# Patient Record
Sex: Male | Born: 1940 | Race: White | Hispanic: No | Marital: Married | Smoking: Former smoker
Health system: Southern US, Community
[De-identification: ages and names within clinical notes are randomized; demographics above are authoritative.]

## PROBLEM LIST (undated history)

## (undated) DIAGNOSIS — E785 Hyperlipidemia, unspecified: Secondary | ICD-10-CM

## (undated) DIAGNOSIS — Z950 Presence of cardiac pacemaker: Secondary | ICD-10-CM

## (undated) DIAGNOSIS — I10 Essential (primary) hypertension: Secondary | ICD-10-CM

## (undated) DIAGNOSIS — E119 Type 2 diabetes mellitus without complications: Secondary | ICD-10-CM

## (undated) DIAGNOSIS — I4510 Unspecified right bundle-branch block: Secondary | ICD-10-CM

## (undated) DIAGNOSIS — I442 Atrioventricular block, complete: Secondary | ICD-10-CM

## (undated) DIAGNOSIS — N189 Chronic kidney disease, unspecified: Secondary | ICD-10-CM

## (undated) DIAGNOSIS — R55 Syncope and collapse: Secondary | ICD-10-CM

## (undated) DIAGNOSIS — R011 Cardiac murmur, unspecified: Secondary | ICD-10-CM

## (undated) HISTORY — DX: Syncope and collapse: R55

## (undated) HISTORY — DX: Atrioventricular block, complete: I44.2

## (undated) HISTORY — DX: Unspecified right bundle-branch block: I45.10

## (undated) HISTORY — PX: PACEMAKER INSERTION: SHX728

## (undated) HISTORY — DX: Cardiac murmur, unspecified: R01.1

## (undated) HISTORY — DX: Hyperlipidemia, unspecified: E78.5

## (undated) HISTORY — DX: Essential (primary) hypertension: I10

## (undated) HISTORY — DX: Chronic kidney disease, unspecified: N18.9

## (undated) HISTORY — DX: Type 2 diabetes mellitus without complications: E11.9

---

## 2010-04-04 ENCOUNTER — Emergency Department: Payer: Self-pay | Admitting: Emergency Medicine

## 2010-05-06 ENCOUNTER — Ambulatory Visit: Payer: Self-pay | Admitting: Internal Medicine

## 2013-08-18 ENCOUNTER — Ambulatory Visit: Payer: Self-pay | Admitting: Internal Medicine

## 2013-09-02 ENCOUNTER — Ambulatory Visit: Payer: Self-pay | Admitting: Internal Medicine

## 2013-10-08 ENCOUNTER — Inpatient Hospital Stay: Payer: Self-pay | Admitting: Internal Medicine

## 2013-10-08 LAB — BASIC METABOLIC PANEL
ANION GAP: 12 (ref 7–16)
Anion Gap: 8 (ref 7–16)
Anion Gap: 8 (ref 7–16)
BUN: 56 mg/dL — AB (ref 7–18)
BUN: 56 mg/dL — AB (ref 7–18)
BUN: 53 mg/dL — ABNORMAL HIGH (ref 7–18)
CALCIUM: 10.4 mg/dL — AB (ref 8.5–10.1)
CALCIUM: 11.1 mg/dL — AB (ref 8.5–10.1)
CO2: 22 mmol/L (ref 21–32)
CREATININE: 2.49 mg/dL — AB (ref 0.60–1.30)
Calcium, Total: 10.5 mg/dL — ABNORMAL HIGH (ref 8.5–10.1)
Chloride: 84 mmol/L — ABNORMAL LOW (ref 98–107)
Chloride: 91 mmol/L — ABNORMAL LOW (ref 98–107)
Chloride: 97 mmol/L — ABNORMAL LOW (ref 98–107)
Co2: 29 mmol/L (ref 21–32)
Co2: 29 mmol/L (ref 21–32)
Creatinine: 2.71 mg/dL — ABNORMAL HIGH (ref 0.60–1.30)
Creatinine: 2.54 mg/dL — ABNORMAL HIGH (ref 0.60–1.30)
EGFR (African American): 26 — ABNORMAL LOW
EGFR (African American): 28 — ABNORMAL LOW
EGFR (African American): 29 — ABNORMAL LOW
EGFR (Non-African Amer.): 23 — ABNORMAL LOW
GFR CALC NON AF AMER: 24 — AB
GFR CALC NON AF AMER: 25 — AB
GLUCOSE: 897 mg/dL — AB (ref 65–99)
GLUCOSE: 336 mg/dL — AB (ref 65–99)
Glucose: 572 mg/dL (ref 65–99)
OSMOLALITY: 296 (ref 275–301)
Osmolality: 298 (ref 275–301)
Osmolality: 299 (ref 275–301)
Potassium: 6.1 mmol/L — ABNORMAL HIGH (ref 3.5–5.1)
Potassium: 4.3 mmol/L (ref 3.5–5.1)
Potassium: 4 mmol/L (ref 3.5–5.1)
Sodium: 118 mmol/L — CL (ref 136–145)
Sodium: 128 mmol/L — ABNORMAL LOW (ref 136–145)
Sodium: 134 mmol/L — ABNORMAL LOW (ref 136–145)

## 2013-10-08 LAB — TROPONIN I: Troponin-I: 0.03 ng/mL

## 2013-10-08 LAB — CBC WITH DIFFERENTIAL/PLATELET
BASOS ABS: 0 10*3/uL (ref 0.0–0.1)
BASOS PCT: 0.2 %
EOS ABS: 0.1 10*3/uL (ref 0.0–0.7)
Eosinophil %: 1 %
HCT: 47.5 % (ref 40.0–52.0)
HGB: 15.4 g/dL (ref 13.0–18.0)
LYMPHS ABS: 1.8 10*3/uL (ref 1.0–3.6)
Lymphocyte %: 15.2 %
MCH: 29.3 pg (ref 26.0–34.0)
MCHC: 32.3 g/dL (ref 32.0–36.0)
MCV: 91 fL (ref 80–100)
MONO ABS: 0.6 x10 3/mm (ref 0.2–1.0)
MONOS PCT: 5.1 %
Neutrophil #: 9.3 10*3/uL — ABNORMAL HIGH (ref 1.4–6.5)
Neutrophil %: 78.5 %
PLATELETS: 305 10*3/uL (ref 150–440)
RBC: 5.24 10*6/uL (ref 4.40–5.90)
RDW: 12.8 % (ref 11.5–14.5)
WBC: 11.9 10*3/uL — ABNORMAL HIGH (ref 3.8–10.6)

## 2013-10-08 LAB — MAGNESIUM: Magnesium: 1.8 mg/dL

## 2013-10-08 LAB — HEMOGLOBIN A1C: Hemoglobin A1C: 13.8 % — ABNORMAL HIGH (ref 4.2–6.3)

## 2013-10-09 DIAGNOSIS — I442 Atrioventricular block, complete: Secondary | ICD-10-CM

## 2013-10-09 LAB — URINALYSIS, COMPLETE
BACTERIA: NONE SEEN
Bilirubin,UR: NEGATIVE
Leukocyte Esterase: NEGATIVE
Nitrite: NEGATIVE
PH: 6 (ref 4.5–8.0)
PROTEIN: NEGATIVE
RBC,UR: 2 /HPF (ref 0–5)
SPECIFIC GRAVITY: 1.021 (ref 1.003–1.030)
Squamous Epithelial: NONE SEEN
WBC UR: 1 /HPF (ref 0–5)

## 2013-10-09 LAB — PROTEIN / CREATININE RATIO, URINE
CREATININE, URINE: 53.8 mg/dL (ref 30.0–125.0)
PROTEIN, RANDOM URINE: 18 mg/dL — AB (ref 0–12)
Protein/Creat. Ratio: 335 mg/gCREAT — ABNORMAL HIGH (ref 0–200)

## 2013-10-09 LAB — LIPID PANEL
Cholesterol: 158 mg/dL (ref 0–200)
HDL Cholesterol: 36 mg/dL — ABNORMAL LOW (ref 40–60)
LDL CHOLESTEROL, CALC: 98 mg/dL (ref 0–100)
Triglycerides: 122 mg/dL (ref 0–200)
VLDL Cholesterol, Calc: 24 mg/dL (ref 5–40)

## 2013-10-09 LAB — TROPONIN I
Troponin-I: 0.03 ng/mL
Troponin-I: 0.02 ng/mL

## 2013-10-09 LAB — CBC WITH DIFFERENTIAL/PLATELET
BASOS PCT: 0.2 %
Basophil #: 0 10*3/uL (ref 0.0–0.1)
EOS ABS: 0.1 10*3/uL (ref 0.0–0.7)
Eosinophil %: 0.4 %
HCT: 42.3 % (ref 40.0–52.0)
HGB: 14 g/dL (ref 13.0–18.0)
Lymphocyte #: 0.9 10*3/uL — ABNORMAL LOW (ref 1.0–3.6)
Lymphocyte %: 6 %
MCH: 29.1 pg (ref 26.0–34.0)
MCHC: 33 g/dL (ref 32.0–36.0)
MCV: 88 fL (ref 80–100)
Monocyte #: 0.5 x10 3/mm (ref 0.2–1.0)
Monocyte %: 3.2 %
NEUTROS ABS: 13.5 10*3/uL — AB (ref 1.4–6.5)
Neutrophil %: 90.2 %
Platelet: 238 10*3/uL (ref 150–440)
RBC: 4.79 10*6/uL (ref 4.40–5.90)
RDW: 12.8 % (ref 11.5–14.5)
WBC: 15 10*3/uL — ABNORMAL HIGH (ref 3.8–10.6)

## 2013-10-09 LAB — BASIC METABOLIC PANEL WITH GFR
Anion Gap: 14
BUN: 50 mg/dL — ABNORMAL HIGH
Calcium, Total: 10.2 mg/dL — ABNORMAL HIGH
Chloride: 98 mmol/L
Co2: 24 mmol/L
Creatinine: 2.06 mg/dL — ABNORMAL HIGH
EGFR (African American): 36 — ABNORMAL LOW
EGFR (Non-African Amer.): 31 — ABNORMAL LOW
Glucose: 259 mg/dL — ABNORMAL HIGH
Osmolality: 294
Potassium: 4 mmol/L
Sodium: 136 mmol/L

## 2013-10-09 LAB — URIC ACID: Uric Acid: 8.6 mg/dL — ABNORMAL HIGH

## 2013-10-10 LAB — BASIC METABOLIC PANEL
ANION GAP: 8 (ref 7–16)
BUN: 30 mg/dL — ABNORMAL HIGH (ref 7–18)
Calcium, Total: 9.2 mg/dL (ref 8.5–10.1)
Chloride: 106 mmol/L (ref 98–107)
Co2: 24 mmol/L (ref 21–32)
Creatinine: 1.77 mg/dL — ABNORMAL HIGH (ref 0.60–1.30)
EGFR (African American): 44 — ABNORMAL LOW
EGFR (Non-African Amer.): 38 — ABNORMAL LOW
Glucose: 79 mg/dL (ref 65–99)
Osmolality: 281 (ref 275–301)
Potassium: 3.1 mmol/L — ABNORMAL LOW (ref 3.5–5.1)
Sodium: 138 mmol/L (ref 136–145)

## 2013-10-10 LAB — PROTEIN ELECTROPHORESIS(ARMC)

## 2013-10-10 LAB — KAPPA/LAMBDA FREE LIGHT CHAINS (ARMC)

## 2013-10-15 LAB — UR PROT ELECTROPHORESIS, URINE RANDOM

## 2013-11-18 ENCOUNTER — Encounter: Payer: Self-pay | Admitting: *Deleted

## 2013-11-24 ENCOUNTER — Encounter: Payer: Self-pay | Admitting: *Deleted

## 2013-11-25 ENCOUNTER — Encounter (HOSPITAL_COMMUNITY): Payer: Self-pay | Admitting: Pharmacy Technician

## 2013-11-25 ENCOUNTER — Ambulatory Visit: Payer: Self-pay | Admitting: Internal Medicine

## 2013-11-25 ENCOUNTER — Ambulatory Visit (INDEPENDENT_AMBULATORY_CARE_PROVIDER_SITE_OTHER): Payer: Medicare Other | Admitting: Internal Medicine

## 2013-11-25 ENCOUNTER — Encounter: Payer: Self-pay | Admitting: *Deleted

## 2013-11-25 ENCOUNTER — Encounter: Payer: Self-pay | Admitting: Internal Medicine

## 2013-11-25 VITALS — BP 182/71 | HR 40 | Ht 67.0 in | Wt 163.5 lb

## 2013-11-25 DIAGNOSIS — I498 Other specified cardiac arrhythmias: Secondary | ICD-10-CM

## 2013-11-25 DIAGNOSIS — Z01812 Encounter for preprocedural laboratory examination: Secondary | ICD-10-CM

## 2013-11-25 DIAGNOSIS — R001 Bradycardia, unspecified: Secondary | ICD-10-CM

## 2013-11-25 DIAGNOSIS — I442 Atrioventricular block, complete: Secondary | ICD-10-CM

## 2013-11-25 MED ORDER — SODIUM CHLORIDE 0.9 % IR SOLN
80.0000 mg | Status: DC
  Filled 2013-11-25 (×2): qty 2

## 2013-11-25 MED ORDER — SODIUM CHLORIDE 0.9 % IV SOLN: INTRAVENOUS | Status: DC

## 2013-11-25 MED ORDER — CEFAZOLIN SODIUM-DEXTROSE 2-3 GM-% IV SOLR: 2.0000 g | INTRAVENOUS | Status: DC

## 2013-11-25 MED ORDER — CHLORHEXIDINE GLUCONATE 4 % EX LIQD
60.0000 mL | Freq: Once | CUTANEOUS | Status: DC
  Filled 2013-11-25: qty 60

## 2013-11-25 NOTE — Patient Instructions (Addendum)
Your physician has recommended that you have a pacemaker inserted. A pacemaker is a small device that is placed under the skin of your chest or abdomen to help control abnormal heart rhythms. This device uses electrical pulses to prompt the heart to beat at a normal rate. Pacemakers are used to treat heart rhythms that are too slow. Wire (leads) are attached to the pacemaker that goes into the chambers of you heart. This is done in the hospital and usually requires and overnight stay. Please see the instruction sheet given to you today for more information.  Your procedure will be at [facility] on 11/26/13 at 3 pm. You will need to arrive at 1 pm  Your physician recommends that you have pre procedure labs today:  BMP INR  CBC

## 2013-11-25 NOTE — Progress Notes (Signed)
ELECTROPHYSIOLOGY CONSULT NOTE  Patient ID: Kevin Gibson, MRN: [id number], DOB/AGE: [birth year] 30 y.o. Admit date: (Not on file) Date of Consult: 11/25/2013  Primary Physician: Kirk Ruths., MD Primary Cardiologist: new   Chief Complaint:  Syncope and bradycardia   HPI Kevin Gibson is a 73 y.o. male  The past medical history notable for diabetes hypertension and moderate renal insufficiency with a history of abrupt onset offset syncope earlier in May 2015. He presented to hospital and was found to be in complete heart block (ECG not available). He was on carvedilol and this was discontinued. 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  He underwent a noninvasive cardiac evaluation including echocardiogram demonstrated normal left ventricular function and Myoview scanning demonstrated no ischemia.  Carvedilol was discontinued and he was discharged home. He continues to complain of exercise intolerance and his heart rate which was moderately 70 is now in the 40s.  He denies exertional chest pain. He has had no palpitations. He's had no recurrent syncope. He has not had peripheral edema.      Past Medical History  Diagnosis Date  . Hypertension   . Diabetes mellitus without complication   . Hyperlipidemia   . Chronic kidney disease   . Third degree heart block   . RBBB   . Syncope and collapse   . Heart murmur       Surgical History: History reviewed. No pertinent past surgical history.   Home Meds: Prior to Admission medications   Medication Sig Start Date End Date Taking? Authorizing Provider  insulin NPH-regular Human (NOVOLIN 70/30) (70-30) 100 UNIT/ML injection Inject 20 Units into the skin 2 (two) times daily with a meal.   Yes Historical Provider, MD  lisinopril-hydrochlorothiazide (PRINZIDE,ZESTORETIC) 20-25 MG per tablet Take 1 tablet by mouth daily.   Yes Historical Provider, MD  losartan (COZAAR) 100 MG tablet Take  100 mg by mouth daily.   Yes Historical Provider, MD      Allergies: No Known Allergies  History   Social History  . Marital Status: Unknown    Spouse Name: N/A    Number of Children: N/A  . Years of Education: N/A   Occupational History  . Not on file.   Social History Main Topics  . Smoking status: Former Smoker -- 0 years    Types: Cigarettes  . Smokeless tobacco: Not on file  . Alcohol Use: No  . Drug Use: No  . Sexual Activity: Not on file   Other Topics Concern  . Not on file   Social History Narrative  . No narrative on file     Family History  Problem Relation Age of Onset  . Diabetes Mother      ROS:  Please see the history of present illness.     All other systems reviewed and negative.    Physical Exam: Blood pressure 182/71, pulse 40, height 5\' 7"  (1.702 m), weight 163 lb 8 oz (74.163 kg). General: Well developed, well nourished male in no acute distress. Head: Normocephalic, atraumatic, sclera non-icteric, no xanthomas, nares are without discharge. EENT: normal Lymph Nodes:  none Back: without scoliosis/kyphosis , no CVA tendersness Neck: Negative for carotid bruits. JVD 7-8 cm scan in a way as Lungs: Clear bilaterally to auscultation without wheezes, rales, or rhonchi. Breathing is unlabored. Heart: RRR with S1 S2.  2/6 systolic\ murmur , rubs, or gallops appreciated. Abdomen: Soft, non-tender, non-distended with normoactive bowel sounds. No hepatomegaly. No rebound/guarding. No obvious abdominal  masses. Msk:  Strength and tone appear normal for age. Extremities: No clubbing or cyanosis. No edema.  Distal pedal pulses are 2+ and equal bilaterally. Skin: Warm and Dry Neuro: Alert and oriented X 3. CN III-XII intact Grossly normal sensory and motor function . Psych:  Responds to questions appropriately with a normal affect.      Labs: Cardiac Enzymes No results found for this basename: CKTOTAL, CKMB, TROPONINI,  in the last 72 hours CBC No  results found for this basename: WBC, HGB, HCT, MCV, PLT   PROTIME: No results found for this basename: LABPROT, INR,  in the last 72 hours Chemistry No results found for this basename: NA, K, CL, CO2, BUN, CREATININE, CALCIUM, LABALBU, PROT, BILITOT, ALKPHOS, ALT, AST, GLUCOSE,  in the last 168 hours Lipids No results found for this basename: CHOL, HDL, LDLCALC, TRIG   BNP No results found for this basename: probnp   Miscellaneous No results found for this basename: DDIMER    Radiology/Studies:     EKG sinus rhythm at 80 beats per minute with complete heart block and a right bundle branch left axis deviation ST  Assessment and Plan: Syncope consistent with Stokes Adams attacks  Complete heart block with junctional escape  Right bundle branch block left anterior fascicular block  Hypertension-poorly controlled  Diabetes-poorly controlled  The patient has high-grade heart block. He has had recurrent syncope. He is advised not to drive. At this point he is not on any medications noncontributory heart block. Pacing is indicated.  The benefits and risks were reviewed including but not limited to death,  perforation, infection, lead dislodgement and device malfunction.  The patient understands agrees and is willing to proceed.  He will need aggressive management of his blood pressure.  He is advised not to drive.  [name]

## 2013-11-26 ENCOUNTER — Inpatient Hospital Stay (HOSPITAL_COMMUNITY)
Admission: RE | Admit: 2013-11-26 | Discharge: 2013-11-28 | DRG: 244 | Disposition: A | Discharge status: Home / Self Care | Payer: Medicare Other | Source: Ambulatory Visit | Attending: Internal Medicine | Admitting: Internal Medicine

## 2013-11-26 ENCOUNTER — Inpatient Hospital Stay (HOSPITAL_COMMUNITY): Payer: Medicare Other

## 2013-11-26 ENCOUNTER — Encounter (HOSPITAL_COMMUNITY)
Admission: RE | Disposition: A | Discharge status: Home / Self Care | Payer: Self-pay | Source: Ambulatory Visit | Attending: Internal Medicine

## 2013-11-26 ENCOUNTER — Encounter (HOSPITAL_COMMUNITY): Payer: Self-pay | Admitting: Anesthesiology

## 2013-11-26 DIAGNOSIS — E119 Type 2 diabetes mellitus without complications: Secondary | ICD-10-CM | POA: Diagnosis present

## 2013-11-26 DIAGNOSIS — E1122 Type 2 diabetes mellitus with diabetic chronic kidney disease: Secondary | ICD-10-CM

## 2013-11-26 DIAGNOSIS — I452 Bifascicular block: Secondary | ICD-10-CM | POA: Diagnosis present

## 2013-11-26 DIAGNOSIS — Z950 Presence of cardiac pacemaker: Secondary | ICD-10-CM

## 2013-11-26 DIAGNOSIS — I129 Hypertensive chronic kidney disease with stage 1 through stage 4 chronic kidney disease, or unspecified chronic kidney disease: Secondary | ICD-10-CM | POA: Diagnosis present

## 2013-11-26 DIAGNOSIS — R55 Syncope and collapse: Secondary | ICD-10-CM | POA: Diagnosis present

## 2013-11-26 DIAGNOSIS — E785 Hyperlipidemia, unspecified: Secondary | ICD-10-CM | POA: Diagnosis present

## 2013-11-26 DIAGNOSIS — I442 Atrioventricular block, complete: Secondary | ICD-10-CM

## 2013-11-26 DIAGNOSIS — E1322 Other specified diabetes mellitus with diabetic chronic kidney disease: Secondary | ICD-10-CM

## 2013-11-26 DIAGNOSIS — Z833 Family history of diabetes mellitus: Secondary | ICD-10-CM

## 2013-11-26 DIAGNOSIS — N189 Chronic kidney disease, unspecified: Secondary | ICD-10-CM | POA: Diagnosis present

## 2013-11-26 DIAGNOSIS — I498 Other specified cardiac arrhythmias: Secondary | ICD-10-CM | POA: Diagnosis present

## 2013-11-26 DIAGNOSIS — Z87891 Personal history of nicotine dependence: Secondary | ICD-10-CM

## 2013-11-26 DIAGNOSIS — Z7982 Long term (current) use of aspirin: Secondary | ICD-10-CM

## 2013-11-26 DIAGNOSIS — E1159 Type 2 diabetes mellitus with other circulatory complications: Secondary | ICD-10-CM

## 2013-11-26 DIAGNOSIS — Z79899 Other long term (current) drug therapy: Secondary | ICD-10-CM

## 2013-11-26 DIAGNOSIS — I152 Hypertension secondary to endocrine disorders: Secondary | ICD-10-CM

## 2013-11-26 DIAGNOSIS — Z794 Long term (current) use of insulin: Secondary | ICD-10-CM

## 2013-11-26 DIAGNOSIS — I10 Essential (primary) hypertension: Secondary | ICD-10-CM

## 2013-11-26 HISTORY — DX: Presence of cardiac pacemaker: Z95.0

## 2013-11-26 HISTORY — PX: PERMANENT PACEMAKER INSERTION: SHX5480

## 2013-11-26 LAB — PROTIME-INR
INR: 1.1 (ref 0.8–1.2)
PROTHROMBIN TIME: 10.8 s (ref 9.1–12.0)

## 2013-11-26 LAB — CBC WITH DIFFERENTIAL
BASOS ABS: 0 10*3/uL (ref 0.0–0.2)
Basos: 0 %
EOS: 5 %
Eosinophils Absolute: 0.3 10*3/uL (ref 0.0–0.4)
HCT: 38.1 % (ref 37.5–51.0)
Hemoglobin: 12.4 g/dL — ABNORMAL LOW (ref 12.6–17.7)
IMMATURE GRANS (ABS): 0 10*3/uL (ref 0.0–0.1)
IMMATURE GRANULOCYTES: 0 %
LYMPHS ABS: 1.3 10*3/uL (ref 0.7–3.1)
Lymphs: 25 %
MCH: 29.3 pg (ref 26.6–33.0)
MCHC: 32.5 g/dL (ref 31.5–35.7)
MCV: 90 fL (ref 79–97)
MONOS ABS: 0.3 10*3/uL (ref 0.1–0.9)
Monocytes: 7 %
NEUTROS PCT: 63 %
Neutrophils Absolute: 3.1 10*3/uL (ref 1.4–7.0)
Platelets: 193 10*3/uL (ref 150–379)
RBC: 4.23 x10E6/uL (ref 4.14–5.80)
RDW: 14.3 % (ref 12.3–15.4)
WBC: 5 10*3/uL (ref 3.4–10.8)

## 2013-11-26 LAB — BASIC METABOLIC PANEL
BUN/Creatinine Ratio: 11 (ref 10–22)
BUN: 18 mg/dL (ref 8–27)
CALCIUM: 9.3 mg/dL (ref 8.6–10.2)
CO2: 21 mmol/L (ref 18–29)
Chloride: 99 mmol/L (ref 97–108)
Creatinine, Ser: 1.66 mg/dL — ABNORMAL HIGH (ref 0.76–1.27)
GFR calc Af Amer: 47 mL/min/{1.73_m2} — ABNORMAL LOW (ref >59)
GFR calc non Af Amer: 41 mL/min/{1.73_m2} — ABNORMAL LOW (ref >59)
Glucose: 120 mg/dL — ABNORMAL HIGH (ref 65–99)
Potassium: 3.9 mmol/L (ref 3.5–5.2)
SODIUM: 140 mmol/L (ref 134–144)

## 2013-11-26 LAB — SURGICAL PCR SCREEN
MRSA, PCR: NEGATIVE
STAPHYLOCOCCUS AUREUS: POSITIVE — AB

## 2013-11-26 LAB — GLUCOSE, CAPILLARY
Glucose-Capillary: 112 mg/dL — ABNORMAL HIGH (ref 70–99)
Glucose-Capillary: 103 mg/dL — ABNORMAL HIGH (ref 70–99)

## 2013-11-26 SURGERY — PERMANENT PACEMAKER INSERTION
Anesthesia: LOCAL

## 2013-11-26 MED ORDER — CEFAZOLIN SODIUM 1-5 GM-% IV SOLN
1.0000 g | Freq: Four times a day (QID) | INTRAVENOUS | Status: AC
  Administered 2013-11-26 – 2013-11-27 (×3): 1 g via INTRAVENOUS
  Filled 2013-11-26 (×3): qty 50

## 2013-11-26 MED ORDER — SODIUM CHLORIDE 0.9 % IV SOLN
INTRAVENOUS | Status: AC
  Administered 2013-11-26: 18:00:00 via INTRAVENOUS

## 2013-11-26 MED ORDER — MUPIROCIN 2 % EX OINT
1.0000 "application " | TOPICAL_OINTMENT | Freq: Two times a day (BID) | CUTANEOUS | Status: DC
  Administered 2013-11-26 – 2013-11-28 (×4): 1 via NASAL
  Filled 2013-11-26: qty 22

## 2013-11-26 MED ORDER — ASPIRIN EC 81 MG PO TBEC
81.0000 mg | DELAYED_RELEASE_TABLET | ORAL | Status: DC
  Administered 2013-11-28: 81 mg via ORAL
  Filled 2013-11-26 (×2): qty 1

## 2013-11-26 MED ORDER — LABETALOL HCL 200 MG PO TABS
200.0000 mg | ORAL_TABLET | Freq: Two times a day (BID) | ORAL | Status: DC
  Administered 2013-11-26 – 2013-11-28 (×4): 200 mg via ORAL
  Filled 2013-11-26 (×7): qty 1

## 2013-11-26 MED ORDER — INSULIN ASPART PROT & ASPART (70-30 MIX) 100 UNIT/ML ~~LOC~~ SUSP
20.0000 [IU] | Freq: Two times a day (BID) | SUBCUTANEOUS | Status: DC
  Administered 2013-11-26 – 2013-11-28 (×4): 20 [IU] via SUBCUTANEOUS
  Filled 2013-11-26: qty 10

## 2013-11-26 MED ORDER — FENTANYL CITRATE 0.05 MG/ML IJ SOLN
INTRAMUSCULAR | Status: AC
  Filled 2013-11-26: qty 2

## 2013-11-26 MED ORDER — MIDAZOLAM HCL 5 MG/5ML IJ SOLN
INTRAMUSCULAR | Status: AC
  Filled 2013-11-26: qty 5

## 2013-11-26 MED ORDER — HYDROCHLOROTHIAZIDE 25 MG PO TABS
25.0000 mg | ORAL_TABLET | Freq: Every day | ORAL | Status: DC
  Administered 2013-11-26 – 2013-11-28 (×3): 25 mg via ORAL
  Filled 2013-11-26 (×3): qty 1

## 2013-11-26 MED ORDER — CHLORHEXIDINE GLUCONATE CLOTH 2 % EX PADS
6.0000 | MEDICATED_PAD | Freq: Every day | CUTANEOUS | Status: DC
  Administered 2013-11-26 – 2013-11-27 (×2): 6 via TOPICAL

## 2013-11-26 MED ORDER — CHLORHEXIDINE GLUCONATE 4 % EX LIQD: 60.0000 mL | Freq: Once | CUTANEOUS | Status: DC

## 2013-11-26 MED ORDER — MUPIROCIN 2 % EX OINT
TOPICAL_OINTMENT | CUTANEOUS | Status: AC
  Filled 2013-11-26: qty 22

## 2013-11-26 MED ORDER — ACETAMINOPHEN 325 MG PO TABS: 325.0000 mg | ORAL_TABLET | ORAL | Status: DC | PRN

## 2013-11-26 MED ORDER — SODIUM CHLORIDE 0.9 % IV SOLN
INTRAVENOUS | Status: DC
  Administered 2013-11-26: 14:00:00 via INTRAVENOUS

## 2013-11-26 MED ORDER — YOU HAVE A PACEMAKER BOOK
Freq: Once | Status: AC
Start: 2013-11-26 — End: 2013-11-26
  Administered 2013-11-26: 18:00:00
  Filled 2013-11-26: qty 1

## 2013-11-26 MED ORDER — MUPIROCIN 2 % EX OINT
TOPICAL_OINTMENT | Freq: Once | CUTANEOUS | Status: AC
  Administered 2013-11-26: 1 via NASAL

## 2013-11-26 MED ORDER — CEFAZOLIN SODIUM-DEXTROSE 2-3 GM-% IV SOLR: 2.0000 g | INTRAVENOUS | Status: DC

## 2013-11-26 MED ORDER — LIDOCAINE HCL (PF) 1 % IJ SOLN
INTRAMUSCULAR | Status: AC
  Filled 2013-11-26: qty 60

## 2013-11-26 MED ORDER — SODIUM CHLORIDE 0.9 % IR SOLN: 80.0000 mg | Status: DC

## 2013-11-26 MED ORDER — SODIUM CHLORIDE 0.9 % IV SOLN
INTRAVENOUS | Status: DC
Start: 2013-11-26 — End: 2013-11-26
  Administered 2013-11-26: 14:00:00 via INTRAVENOUS

## 2013-11-26 MED ORDER — LABETALOL HCL 5 MG/ML IV SOLN
20.0000 mg | Freq: Once | INTRAVENOUS | Status: AC
  Administered 2013-11-26: 20 mg via INTRAVENOUS
  Filled 2013-11-26: qty 4

## 2013-11-26 MED ORDER — LISINOPRIL 20 MG PO TABS
20.0000 mg | ORAL_TABLET | Freq: Every day | ORAL | Status: DC
  Administered 2013-11-26 – 2013-11-28 (×3): 20 mg via ORAL
  Filled 2013-11-26 (×3): qty 1

## 2013-11-26 MED ORDER — ONDANSETRON HCL 4 MG/2ML IJ SOLN: 4.0000 mg | Freq: Four times a day (QID) | INTRAMUSCULAR | Status: DC | PRN

## 2013-11-26 MED ORDER — LOSARTAN POTASSIUM 50 MG PO TABS
100.0000 mg | ORAL_TABLET | Freq: Every day | ORAL | Status: DC
  Administered 2013-11-26 – 2013-11-28 (×3): 100 mg via ORAL
  Filled 2013-11-26 (×3): qty 2

## 2013-11-26 MED ORDER — CEFAZOLIN SODIUM-DEXTROSE 2-3 GM-% IV SOLR
INTRAVENOUS | Status: AC
Start: 2013-11-26 — End: 2013-11-27
  Filled 2013-11-26: qty 50

## 2013-11-26 MED ORDER — HEPARIN (PORCINE) IN NACL 2-0.9 UNIT/ML-% IJ SOLN
INTRAMUSCULAR | Status: AC
  Filled 2013-11-26: qty 500

## 2013-11-26 MED ORDER — LISINOPRIL-HYDROCHLOROTHIAZIDE 20-25 MG PO TABS: 1.0000 | ORAL_TABLET | Freq: Every day | ORAL | Status: DC

## 2013-11-26 MED ORDER — PNEUMOCOCCAL VAC POLYVALENT 25 MCG/0.5ML IJ INJ
0.5000 mL | INJECTION | INTRAMUSCULAR | Status: AC
Start: 2013-11-27 — End: 2013-11-27
  Administered 2013-11-27: 0.5 mL via INTRAMUSCULAR
  Filled 2013-11-26 (×2): qty 0.5

## 2013-11-26 NOTE — Progress Notes (Signed)
PT HAS NO RELIABLE UNDERLYING RHTYHM FOLLOWINg pacer insertion    He is now device dependent and as such will be admitted to the ICU with transcutaneous pads and anticpate 48 hrs of inhospital observation   If he has underlying rhythm in the AM we will discharge tomorrow

## 2013-11-26 NOTE — CV Procedure (Signed)
Preop DX::complete heart block  Post op DX:: same  Procedure  dual pacemaker implantation  After routine prep and drape, lidocaine was infiltrated in the prepectoral subclavicular region on the left side an incision was made and carried down to later the prepectoral fascia using electrocautery and sharp dissection a pocket was formed similarly. Hemostasis was obtained.  After this, we turned our attention to gaining accessm to the extrathoracic,left subclavian vein. This was accomplished without difficulty and without the aspiration of air or puncture of the artery. 2 separate venipunctures were accomplished; guidewires were placed and retained and sequentially 7 French sheath through which were  passed an Medtronic MRI compatible 5086    ventricular lead serial [id number] and an Medtronic MRI compatible 5086   atrial lead serial number [id number] .  The ventricular lead was manipulated to the right ventricular apex with a bipolar R wave was 6.9, the pacing impedance was 795, the threshold was <2 @ 0.5 msec  Current at threshold was   3  Ma and the current of injury was  BRISK.  The right atrial lead was manipulated to the right atrial appendage  with a bipolar P-wave  1.4, the pacing impedance was 727, the threshold 1.1@ 0.5 msec   Current at threshold was 1.4  Ma and the current of injury was BRISK.  The ventricular lead was marked with a tie prior to the insertion of the atrial lead. The leads were affixed to the prepectoral fascia and attached to a  Medtronic MRI compatible pulse generator [id number] ADVISA  pulse generator serial number [id number].  Hemostasis was obtained. The pocket was copiously irrigated with antibiotic containing saline solution. The leads and the pulse generator were placed in the pocket and affixed to the prepectoral fascia. The wound was then closed in 2 layers in the normal fashion.  The wound was washed dried . And a dermabond adhesive was applied    Needle  Count,  sponge counts and instrument counts were correct at the end of the procedure .   The patient tolerated the procedure without apparent complication.  Kevin Gibson.D.

## 2013-11-26 NOTE — H&P (View-Only) (Signed)
ELECTROPHYSIOLOGY CONSULT NOTE  Patient ID: Kevin Gibson, MRN: [id number], DOB/AGE: 06-12-1940 73 y.o. Admit date: (Not on file) Date of Consult: 11/25/2013  Primary Physician: Kirk Ruths., MD Primary Cardiologist: new   Chief Complaint:  Syncope and bradycardia   HPI Kevin Gibson is a 72 y.o. male  The past medical history notable for diabetes hypertension and moderate renal insufficiency with a history of abrupt onset offset syncope earlier in May 2015. He presented to hospital and was found to be in complete heart block (ECG not available). He was on carvedilol and this was discontinued. 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  He underwent a noninvasive cardiac evaluation including echocardiogram demonstrated normal left ventricular function and Myoview scanning demonstrated no ischemia.  Carvedilol was discontinued and he was discharged home. He continues to complain of exercise intolerance and his heart rate which was moderately 70 is now in the 40s.  He denies exertional chest pain. He has had no palpitations. He's had no recurrent syncope. He has not had peripheral edema.      Past Medical History  Diagnosis Date  . Hypertension   . Diabetes mellitus without complication   . Hyperlipidemia   . Chronic kidney disease   . Third degree heart block   . RBBB   . Syncope and collapse   . Heart murmur       Surgical History: History reviewed. No pertinent past surgical history.   Home Meds: Prior to Admission medications   Medication Sig Start Date End Date Taking? Authorizing Provider  insulin NPH-regular Human (NOVOLIN 70/30) (70-30) 100 UNIT/ML injection Inject 20 Units into the skin 2 (two) times daily with a meal.   Yes Historical Provider, MD  lisinopril-hydrochlorothiazide (PRINZIDE,ZESTORETIC) 20-25 MG per tablet Take 1 tablet by mouth daily.   Yes Historical Provider, MD  losartan (COZAAR) 100 MG tablet Take  100 mg by mouth daily.   Yes Historical Provider, MD      Allergies: No Known Allergies  History   Social History  . Marital Status: Unknown    Spouse Name: N/A    Number of Children: N/A  . Years of Education: N/A   Occupational History  . Not on file.   Social History Main Topics  . Smoking status: Former Smoker -- 0 years    Types: Cigarettes  . Smokeless tobacco: Not on file  . Alcohol Use: No  . Drug Use: No  . Sexual Activity: Not on file   Other Topics Concern  . Not on file   Social History Narrative  . No narrative on file     Family History  Problem Relation Age of Onset  . Diabetes Mother      ROS:  Please see the history of present illness.     All other systems reviewed and negative.    Physical Exam: Blood pressure 182/71, pulse 40, height 5\' 7"  (1.702 m), weight 163 lb 8 oz (74.163 kg). General: Well developed, well nourished male in no acute distress. Head: Normocephalic, atraumatic, sclera non-icteric, no xanthomas, nares are without discharge. EENT: normal Lymph Nodes:  none Back: without scoliosis/kyphosis , no CVA tendersness Neck: Negative for carotid bruits. JVD 7-8 cm scan in a way as Lungs: Clear bilaterally to auscultation without wheezes, rales, or rhonchi. Breathing is unlabored. Heart: RRR with S1 S2.  2/6 systolic\ murmur , rubs, or gallops appreciated. Abdomen: Soft, non-tender, non-distended with normoactive bowel sounds. No hepatomegaly. No rebound/guarding. No obvious abdominal  masses. Msk:  Strength and tone appear normal for age. Extremities: No clubbing or cyanosis. No edema.  Distal pedal pulses are 2+ and equal bilaterally. Skin: Warm and Dry Neuro: Alert and oriented X 3. CN III-XII intact Grossly normal sensory and motor function . Psych:  Responds to questions appropriately with a normal affect.      Labs: Cardiac Enzymes No results found for this basename: CKTOTAL, CKMB, TROPONINI,  in the last 72 hours CBC No  results found for this basename: WBC, HGB, HCT, MCV, PLT   PROTIME: No results found for this basename: LABPROT, INR,  in the last 72 hours Chemistry No results found for this basename: NA, K, CL, CO2, BUN, CREATININE, CALCIUM, LABALBU, PROT, BILITOT, ALKPHOS, ALT, AST, GLUCOSE,  in the last 168 hours Lipids No results found for this basename: CHOL, HDL, LDLCALC, TRIG   BNP No results found for this basename: probnp   Miscellaneous No results found for this basename: DDIMER    Radiology/Studies:     EKG sinus rhythm at 80 beats per minute with complete heart block and a right bundle branch left axis deviation ST  Assessment and Plan: Syncope consistent with Stokes Adams attacks  Complete heart block with junctional escape  Right bundle branch block left anterior fascicular block  Hypertension-poorly controlled  Diabetes-poorly controlled  The patient has high-grade heart block. He has had recurrent syncope. He is advised not to drive. At this point he is not on any medications noncontributory heart block. Pacing is indicated.  The benefits and risks were reviewed including but not limited to death,  perforation, infection, lead dislodgement and device malfunction.  The patient understands agrees and is willing to proceed.  He will need aggressive management of his blood pressure.  He is advised not to drive.  [name]

## 2013-11-26 NOTE — Interval H&P Note (Signed)
History and Physical Interval Note:  11/26/2013 3:25 PM  Kevin Gibson  has presented today for surgery, with the diagnosis of hb  The various methods of treatment have been discussed with the patient and family. After consideration of risks, benefits and other options for treatment, the patient has consented to  Procedure(s): PERMANENT PACEMAKER INSERTION (N/A) as a surgical intervention .  The patient's history has been reviewed, patient examined, no change in status, stable for surgery.  I have reviewed the patient's chart and labs.  Questions were answered to the patient's satisfaction.     [name]

## 2013-11-27 DIAGNOSIS — E1159 Type 2 diabetes mellitus with other circulatory complications: Secondary | ICD-10-CM

## 2013-11-27 DIAGNOSIS — I498 Other specified cardiac arrhythmias: Secondary | ICD-10-CM | POA: Diagnosis not present

## 2013-11-27 DIAGNOSIS — E119 Type 2 diabetes mellitus without complications: Secondary | ICD-10-CM | POA: Diagnosis not present

## 2013-11-27 DIAGNOSIS — I452 Bifascicular block: Secondary | ICD-10-CM | POA: Diagnosis not present

## 2013-11-27 DIAGNOSIS — Z950 Presence of cardiac pacemaker: Secondary | ICD-10-CM

## 2013-11-27 DIAGNOSIS — I442 Atrioventricular block, complete: Secondary | ICD-10-CM | POA: Diagnosis not present

## 2013-11-27 DIAGNOSIS — E1122 Type 2 diabetes mellitus with diabetic chronic kidney disease: Secondary | ICD-10-CM

## 2013-11-27 DIAGNOSIS — I10 Essential (primary) hypertension: Secondary | ICD-10-CM

## 2013-11-27 LAB — GLUCOSE, CAPILLARY: Glucose-Capillary: 226 mg/dL — ABNORMAL HIGH (ref 70–99)

## 2013-11-27 NOTE — Progress Notes (Signed)
   ELECTROPHYSIOLOGY ROUNDING NOTE    Patient Name: Kevin Gibson Date of Encounter: 11/27/2013    SUBJECTIVE:Patient feels well this morning.  No chest pain or shortness of breath. Feels that energy level is improved. S/p PPM yesterday  TELEMETRY: Reviewed telemetry pt in sinus rhythm with ventricular pacing Filed Vitals:   11/27/13 0600 11/27/13 0700 11/27/13 0743 11/27/13 0800  BP: 160/76 158/76  178/81  Pulse: 60 66    Temp:   98.4 F (36.9 C)   TempSrc:   Oral   Resp: 14 14  19   Height:      Weight:      SpO2: 96% 94%    PHYSICAL EXAM Well developed and nourished in no acute distress HENT normal Neck supple with JVP-flat Clear Regular rate and rhythm, no murmurs or gallops Abd-soft with active BS No Clubbing cyanosis edema Skin-warm and dry A & Oriented  Grossly normal sensory and motor function No hemaotoma  Intake/Output Summary (Last 24 hours) at 11/27/13 0824 Last data filed at 11/27/13 0800  Gross per 24 hour  Intake 453.33 ml  Output      0 ml  Net 453.33 ml    CURRENT MEDICATIONS: . aspirin EC  81 mg Oral QODAY  . Chlorhexidine Gluconate Cloth  6 each Topical Daily  . lisinopril  20 mg Oral Daily   And  . hydrochlorothiazide  25 mg Oral Daily  . insulin aspart protamine- aspart  20 Units Subcutaneous BID WC  . labetalol  200 mg Oral BID  . losartan  100 mg Oral Daily  . mupirocin ointment  1 application Nasal BID  . pneumococcal 23 valent vaccine  0.5 mL Intramuscular Tomorrow-1000    LABS: Basic Metabolic Panel:  Recent Labs  11/25/13 0919  NA 140  K 3.9  CL 99  CO2 21  GLUCOSE 120*  BUN 18  CREATININE 1.66*  CALCIUM 9.3   CBC:  Recent Labs  11/25/13 0919  WBC 5.0  NEUTROABS 3.1  HGB 12.4*  HCT 38.1  MCV 90  PLT 193     Radiology/Studies:  Final result pending, leads in stable position.    DEVICE INTERROGATION: Device interrogated by industry.  Lead values including impedence, sensing, threshold within normal  values.    Active Problems:   Atrioventricular block, complete   Pacemaker-medtronic  MRI compatible   Hypertension complicating diabetes   Diabetes mellitus with chronic kidney disease  no intrinsic rhythm this am Will ambulate and anticpate discharge in the am Blood pressure still high  Will continue labetolol

## 2013-11-27 NOTE — Discharge Summary (Signed)
ELECTROPHYSIOLOGY PROCEDURE DISCHARGE SUMMARY    Patient ID: Kevin Gibson,  MRN: [id number], DOB/AGE: Nov 01, 1940 73 y.o.  Admit date: 11/26/2013 Discharge date: 11/28/2013  Primary Care Physician: Kirk Ruths., MD Electrophysiologist: Caryl Comes  Primary Discharge Diagnosis:  Complete heart block status post pacemaker implant this admission  Secondary Discharge Diagnosis:  1.  Hypertension 2.  Diabetes 3.  Hyperlipidemia 4.  Chronic kidney disease 5.  RBBB  No Known Allergies   Procedures This Admission:  1.  Implantation of a dual chamber pacemaker on 11-26-2013 by Dr Caryl Comes.  The patient received a MDT Advisa dual chamber pacemaker with model number 5076 right atrial and right ventricular leads.  There were no early apparent complications.  This is a MRI compatible system.  2.  CXR on 11-27-2013 demonstrated no pneumothorax status post pacemaker implantation.   Brief HPI: Kevin Gibson is a 73 y.o. male with a past medical history as above.  He experienced syncope in May of this year and was found to be in CHB.  His carvedilol was discontinued and his conduction improved.  He then developed recurrent complete heart block with recurrent syncope.  Risks, benefits, and alternatives to pacemaker implantation were reviewed with the patient who wished to proceed.  Hospital Course:  The patient was admitted and underwent implantation of a MDT dual chamber pacemaker MRI compatible system with details as outlined above.   He was monitored on telemetry overnight which demonstrated sinus rhythm with ventricular pacing.  Due to device dependence, the patient was monitored for an additional 24 hours in the hospital prior to discharge.  Left chest was without hematoma or ecchymosis.  The device was interrogated and found to be functioning normally.  CXR was obtained and demonstrated no pneumothorax status post device implantation.  Wound care, arm mobility, and restrictions were reviewed  with the patient.  Dr Caryl Comes examined the patient and considered them stable for discharge to home.    Discharge Vitals: Blood pressure 144/74, pulse 80, temperature 97.7 F (36.5 C), temperature source Oral, resp. rate 18, height 5\' 7"  (1.702 m), weight 158 lb 15.2 oz (72.1 kg), SpO2 95.00%.   Labs:   Lab Results  Component Value Date   WBC 5.0 11/25/2013   HGB 12.4* 11/25/2013   HCT 38.1 11/25/2013   MCV 90 11/25/2013   PLT 193 11/25/2013     Recent Labs Lab 11/25/13 0919  NA 140  K 3.9  CL 99  CO2 21  BUN 18  CREATININE 1.66*  CALCIUM 9.3  GLUCOSE 120*    Discharge Medications:    Medication List         aspirin EC 81 MG tablet  Take 81 mg by mouth every other day.     insulin NPH-regular Human (70-30) 100 UNIT/ML injection  Commonly known as:  NOVOLIN 70/30  Inject 20 Units into the skin 2 (two) times daily with a meal.     labetalol 200 MG tablet  Commonly known as:  NORMODYNE  Take 1 tablet (200 mg total) by mouth 2 (two) times daily.     lisinopril-hydrochlorothiazide 20-25 MG per tablet  Commonly known as:  PRINZIDE,ZESTORETIC  Take 1 tablet by mouth daily.     losartan 100 MG tablet  Commonly known as:  COZAAR  Take 100 mg by mouth daily.        Disposition:  Discharge Instructions   Diet - low sodium heart healthy    Complete by:  As directed  Diet Carb Modified    Complete by:  As directed      Increase activity slowly    Complete by:  As directed           Follow-up Information   Follow up with [facility] On 12/11/2013. (Wound check and device check at 11:15 AM)    Contact information:   [place] [phone]       Duration of Discharge Encounter: Greater than 30 minutes including physician time.  Signed,

## 2013-11-28 LAB — GLUCOSE, CAPILLARY
Glucose-Capillary: 162 mg/dL — ABNORMAL HIGH (ref 70–99)
Glucose-Capillary: 89 mg/dL (ref 70–99)
Glucose-Capillary: 108 mg/dL — ABNORMAL HIGH (ref 70–99)

## 2013-11-28 MED ORDER — LABETALOL HCL 200 MG PO TABS: 200.0000 mg | ORAL_TABLET | Freq: Two times a day (BID) | ORAL | Status: DC

## 2013-11-28 NOTE — Discharge Instructions (Signed)
° °  Supplemental Discharge Instructions for  °Pacemaker/Defibrillator Patients ° °Activity °No heavy lifting or vigorous activity with your left/right arm for 6 to 8 weeks.  Do not raise your left/right arm above your head for one week.  Gradually raise your affected arm as drawn below. ° °        ° °            07/12                   07/13                    07/14                    07/15           ° °NO DRIVING for  one week    ; you may begin driving on     07/16     . °WOUND CARE °  Keep the wound area clean and dry.  Do not get this area wet for one week. No showers for one week; you may shower on      07/16        . °  The tape/steri-strips on your wound will fall off; do not pull them off.  No bandage is needed on the site.  DO  NOT apply any creams, oils, or ointments to the wound area. °  If you notice any drainage or discharge from the wound, any swelling or bruising at the site, or you develop a fever > 101? F after you are discharged home, call the office at once. ° °Special Instructions °  You are still able to use cellular telephones; use the ear opposite the side where you have your pacemaker/defibrillator.  Avoid carrying your cellular phone near your device. °  When traveling through airports, show security personnel your identification card to avoid being screened in the metal detectors.  Ask the security personnel to use the hand wand. °  Avoid arc welding equipment, MRI testing (magnetic resonance imaging), TENS units (transcutaneous nerve stimulators).  Call the office for questions about other devices. °  Avoid electrical appliances that are in poor condition or are not properly grounded. °  Microwave ovens are safe to be near or to operate. ° °Additional information for defibrillator patients should your device go off: °  If your device goes off ONCE and you feel fine afterward, notify the device clinic nurses. °  If your device goes off ONCE and you do not feel well afterward, call 911. °   If your device goes off TWICE, call 911. °  If your device goes off THREE times in one day, call 911. ° °DO NOT DRIVE YOURSELF OR A FAMILY MEMBER °WITH A DEFIBRILLATOR TO THE HOSPITAL--CALL 911. °

## 2013-11-28 NOTE — Progress Notes (Signed)
Pacemaker dressing removed, incision with edges approximated, no redness or drainage. Painted with betadine. Discharge instructions given to pt and wife, verbalize understanding. Pt dc'd to home with wife.

## 2013-11-28 NOTE — Progress Notes (Signed)
   ELECTROPHYSIOLOGY ROUNDING NOTE    Patient Name: Kevin Gibson Date of Encounter: 11/28/2013    SUBJECTIVE:Patient feels well this morning.  No chest pain or shortness of breath. Feels that energy level is improved.    TELEMETRY: Reviewed telemetry pt in sinus rhythm with ventricular pacing Filed Vitals:   11/27/13 0946 11/27/13 1016 11/27/13 1944 11/28/13 0447  BP: 162/80 114/81 158/85 121/81  Pulse:  64 65 65  Temp:  98.6 F (37 C) 98.5 F (36.9 C) 97.7 F (36.5 C)  TempSrc:  Oral Oral Oral  Resp:  18 18 18   Height:      Weight:      SpO2:  99% 94% 95%  PHYSICAL EXAM Well developed and nourished in no acute distress HENT normal Neck supple with JVP-flat Clear poclet without hematoma Regular rate and rhythm, no murmurs or gallops Abd-soft with active BS No Clubbing cyanosis edema Skin-warm and dry A & Oriented  Grossly normal sensory and motor function  Intake/Output Summary (Last 24 hours) at 11/28/13 0714 Last data filed at 11/27/13 1831  Gross per 24 hour  Intake    660 ml  Output    550 ml  Net    110 ml    CURRENT MEDICATIONS: . aspirin EC  81 mg Oral QODAY  . Chlorhexidine Gluconate Cloth  6 each Topical Daily  . lisinopril  20 mg Oral Daily   And  . hydrochlorothiazide  25 mg Oral Daily  . insulin aspart protamine- aspart  20 Units Subcutaneous BID WC  . labetalol  200 mg Oral BID  . losartan  100 mg Oral Daily  . mupirocin ointment  1 application Nasal BID    LABS: Basic Metabolic Panel:  Recent Labs  11/25/13 0919  NA 140  K 3.9  CL 99  CO2 21  GLUCOSE 120*  BUN 18  CREATININE 1.66*  CALCIUM 9.3   CBC:  Recent Labs  11/25/13 0919  WBC 5.0  NEUTROABS 3.1  HGB 12.4*  HCT 38.1  MCV 90  PLT 193     Radiology/Studies:  Final result pending, leads in stable position.    DEVICE INTERROGATION: Device interrogated by industry.  Lead values including impedence, sensing, threshold within normal values.    Active  Problems:   Atrioventricular block, complete   Pacemaker-medtronic  MRI compatible   Hypertension complicating diabetes   Diabetes mellitus with chronic kidney disease   Blood pressure labile Discharge on labetolol plus prior meds     Wound check [place]  F/u PCP 2-3 wweeks F/U SK  [place] 12 weeks

## 2013-12-01 ENCOUNTER — Encounter: Payer: Self-pay | Admitting: Internal Medicine

## 2013-12-01 ENCOUNTER — Telehealth: Payer: Self-pay | Admitting: *Deleted

## 2013-12-01 NOTE — Telephone Encounter (Signed)
LMTCB to schedule wound check follow up.   (when they call back offer 7/17)

## 2013-12-02 NOTE — Telephone Encounter (Signed)
Left message on machine explaining to keep currently scheduled wound check follow up on 7/23 in [place].

## 2013-12-11 ENCOUNTER — Encounter: Payer: Medicare Other | Admitting: Internal Medicine

## 2013-12-11 ENCOUNTER — Ambulatory Visit (INDEPENDENT_AMBULATORY_CARE_PROVIDER_SITE_OTHER): Payer: Medicare Other | Admitting: *Deleted

## 2013-12-11 DIAGNOSIS — I442 Atrioventricular block, complete: Secondary | ICD-10-CM

## 2013-12-11 LAB — MDC_IDC_ENUM_SESS_TYPE_INCLINIC
Battery Voltage: 3.09 V
Brady Statistic AP VS Percent: 0 %
Brady Statistic AS VP Percent: 64.08 %
Brady Statistic AS VS Percent: 0 %
Brady Statistic RA Percent Paced: 35.92 %
Lead Channel Impedance Value: 399 Ohm
Lead Channel Pacing Threshold Amplitude: 0.5 V
Lead Channel Pacing Threshold Amplitude: 0.75 V
Lead Channel Pacing Threshold Pulse Width: 0.4 ms
Lead Channel Pacing Threshold Pulse Width: 0.4 ms
Lead Channel Sensing Intrinsic Amplitude: 1.125 mV
Lead Channel Setting Pacing Amplitude: 3.5 V
Lead Channel Setting Pacing Amplitude: 3.5 V
Lead Channel Setting Pacing Pulse Width: 0.4 ms
Lead Channel Setting Sensing Sensitivity: 4 mV
MDC IDC MSMT BATTERY REMAINING LONGEVITY: 119 mo
MDC IDC MSMT LEADCHNL RA IMPEDANCE VALUE: 475 Ohm
MDC IDC MSMT LEADCHNL RA SENSING INTR AMPL: 1.125 mV
MDC IDC MSMT LEADCHNL RV IMPEDANCE VALUE: 456 Ohm
MDC IDC MSMT LEADCHNL RV IMPEDANCE VALUE: 532 Ohm
MDC IDC SESS DTM: 20150723122956
MDC IDC SET ZONE DETECTION INTERVAL: 400 ms
MDC IDC STAT BRADY AP VP PERCENT: 35.92 %
MDC IDC STAT BRADY RV PERCENT PACED: 100 %
Zone Setting Detection Interval: 350 ms

## 2013-12-11 NOTE — Progress Notes (Signed)
Wound check-PPM 

## 2014-01-16 ENCOUNTER — Encounter: Payer: Self-pay | Admitting: Internal Medicine

## 2014-03-03 ENCOUNTER — Encounter: Payer: Self-pay | Admitting: Internal Medicine

## 2014-03-03 ENCOUNTER — Ambulatory Visit (INDEPENDENT_AMBULATORY_CARE_PROVIDER_SITE_OTHER): Payer: Medicare Other | Admitting: Internal Medicine

## 2014-03-03 VITALS — BP 163/85 | HR 69 | Ht 67.0 in | Wt 165.0 lb

## 2014-03-03 DIAGNOSIS — I10 Essential (primary) hypertension: Secondary | ICD-10-CM

## 2014-03-03 DIAGNOSIS — I442 Atrioventricular block, complete: Secondary | ICD-10-CM

## 2014-03-03 DIAGNOSIS — Z45018 Encounter for adjustment and management of other part of cardiac pacemaker: Secondary | ICD-10-CM

## 2014-03-03 DIAGNOSIS — R55 Syncope and collapse: Secondary | ICD-10-CM

## 2014-03-03 LAB — MDC_IDC_ENUM_SESS_TYPE_INCLINIC
Battery Voltage: 3.02 V
Brady Statistic AP VP Percent: 60.4 %
Brady Statistic AP VS Percent: 0 %
Brady Statistic AS VP Percent: 39.6 %
Brady Statistic AS VS Percent: 0 %
Brady Statistic RA Percent Paced: 60.4 %
Brady Statistic RV Percent Paced: 100 %
Date Time Interrogation Session: 20151013145609
Lead Channel Impedance Value: 380 Ohm
Lead Channel Impedance Value: 513 Ohm
Lead Channel Pacing Threshold Amplitude: 0.75 V
Lead Channel Pacing Threshold Amplitude: 0.75 V
Lead Channel Pacing Threshold Pulse Width: 0.4 ms
Lead Channel Sensing Intrinsic Amplitude: 1 mV
Lead Channel Sensing Intrinsic Amplitude: 1.125 mV
Lead Channel Setting Pacing Amplitude: 2.5 V
Lead Channel Setting Pacing Pulse Width: 0.4 ms
MDC IDC MSMT BATTERY REMAINING LONGEVITY: 97 mo
MDC IDC MSMT LEADCHNL RA IMPEDANCE VALUE: 475 Ohm
MDC IDC MSMT LEADCHNL RV IMPEDANCE VALUE: 437 Ohm
MDC IDC MSMT LEADCHNL RV PACING THRESHOLD PULSEWIDTH: 0.4 ms
MDC IDC SET LEADCHNL RA PACING AMPLITUDE: 2 V
MDC IDC SET LEADCHNL RV SENSING SENSITIVITY: 4 mV
MDC IDC SET ZONE DETECTION INTERVAL: 350 ms
MDC IDC SET ZONE DETECTION INTERVAL: 400 ms

## 2014-03-03 MED ORDER — LABETALOL HCL 200 MG PO TABS: 400.0000 mg | ORAL_TABLET | Freq: Two times a day (BID) | ORAL | Status: DC

## 2014-03-03 NOTE — Progress Notes (Signed)
      Patient Care Team: Kirk Ruths, MD as PCP - General (Internal Medicine)   HPI  Kevin Gibson is a 73 y.o. male Seen in followup for syncope with complete heart block DOI 7/15  There is in fact improvement in functional status and no recurrent syncope. He does have complaints however at the device sometimes moves around.  Blood pressure remains elevated. There has been no edema chest pain.  Past Medical History  Diagnosis Date  . Hypertension   . Diabetes mellitus without complication   . Hyperlipidemia   . Chronic kidney disease   . Third degree heart block   . RBBB   . Syncope and collapse   . Heart murmur   . Pacemaker     Past Surgical History  Procedure Laterality Date  . Pacemaker insertion      Current Outpatient Prescriptions  Medication Sig Dispense Refill  . aspirin EC 81 MG tablet Take 81 mg by mouth every other day.      . insulin NPH-regular Human (NOVOLIN 70/30) (70-30) 100 UNIT/ML injection Inject 20 Units into the skin 2 (two) times daily with a meal.      . labetalol (NORMODYNE) 200 MG tablet Take 1 tablet (200 mg total) by mouth 2 (two) times daily.  60 tablet  11  . lisinopril-hydrochlorothiazide (PRINZIDE,ZESTORETIC) 20-25 MG per tablet Take 1 tablet by mouth daily.      Marland Kitchen losartan (COZAAR) 100 MG tablet Take 100 mg by mouth daily.       No current facility-administered medications for this visit.    No Known Allergies  Review of Systems negative except from HPI and PMH  Physical Exam BP 163/85  Pulse 69  Ht 5\' 7"  (1.702 m)  Wt 165 lb (74.844 kg)  BMI 25.84 kg/m2 Well developed and well nourished in no acute distress HENT normal E scleral and icterus clear Neck Supple JVP flat; carotids brisk and full Clear to ausculation Device pocket well healed; without hematoma or erythema.  There is no tethering   Regular rate and rhythm, no murmurs gallops or rub Soft with active bowel sounds No clubbing cyanosis mornings  Edema Alert and oriented, grossly normal motor and sensory function Skin Warm and Dry  ECG demonstrates ventricular pacing  Assessment and  Plan  Complete heart block  Hypertension  Pacemaker-Medtronic  Syncope  The patient is in no recurrent syncope. Device function is normal and was reprogrammed to maximize longevity.  Blood pressure is markedly elevated; we'll increase the labetalol 200--400 mg twice daily. He is to see Dr. Ouida Sills in just a couple of weeks. I wonder whether it makes sense to take him off of his ARB and think about a calcium blocker. I will defer this to his expertise. We will see him in 9 months.

## 2014-03-03 NOTE — Patient Instructions (Signed)
Your physician has recommended you make the following change in your medication:  Increase Labetalol to 400 mg twice daily   Your physician wants you to follow-up in: 9 months with Dr. Caryl Comes. You will receive a reminder letter in the mail two months in advance. If you don't receive a letter, please call our office to schedule the follow-up appointment.  Remote monitoring is used to monitor your Pacemaker of ICD from home. This monitoring reduces the number of office visits required to check your device to one time per year. It allows [place] to keep an eye on the functioning of your device to ensure it is working properly. You are scheduled for a device check from home on 06/03/14. You may send your transmission at any time that day. If you have a wireless device, the transmission will be sent automatically. After your physician reviews your transmission, you will receive a postcard with your next transmission date.

## 2014-03-06 ENCOUNTER — Other Ambulatory Visit: Payer: Self-pay | Admitting: Internal Medicine

## 2014-04-30 ENCOUNTER — Encounter (HOSPITAL_COMMUNITY): Payer: Self-pay | Admitting: Internal Medicine

## 2014-06-03 ENCOUNTER — Ambulatory Visit (INDEPENDENT_AMBULATORY_CARE_PROVIDER_SITE_OTHER): Payer: Medicare Other | Admitting: *Deleted

## 2014-06-03 DIAGNOSIS — I442 Atrioventricular block, complete: Secondary | ICD-10-CM

## 2014-06-03 DIAGNOSIS — Z950 Presence of cardiac pacemaker: Secondary | ICD-10-CM

## 2014-06-03 LAB — MDC_IDC_ENUM_SESS_TYPE_REMOTE
Battery Remaining Longevity: 112 mo
Brady Statistic AP VP Percent: 48.02 %
Brady Statistic AP VS Percent: 0 %
Brady Statistic AS VP Percent: 51.97 %
Brady Statistic RA Percent Paced: 48.02 %
Lead Channel Impedance Value: 456 Ohm
Lead Channel Impedance Value: 475 Ohm
Lead Channel Impedance Value: 532 Ohm
Lead Channel Pacing Threshold Amplitude: 0.625 V
Lead Channel Pacing Threshold Amplitude: 0.625 V
Lead Channel Pacing Threshold Pulse Width: 0.4 ms
Lead Channel Pacing Threshold Pulse Width: 0.4 ms
Lead Channel Sensing Intrinsic Amplitude: 1.125 mV
Lead Channel Sensing Intrinsic Amplitude: 27.875 mV
Lead Channel Setting Pacing Pulse Width: 0.4 ms
Lead Channel Setting Sensing Sensitivity: 4 mV
MDC IDC MSMT BATTERY VOLTAGE: 3.02 V
MDC IDC MSMT LEADCHNL RA IMPEDANCE VALUE: 380 Ohm
MDC IDC MSMT LEADCHNL RA SENSING INTR AMPL: 1.125 mV
MDC IDC MSMT LEADCHNL RV SENSING INTR AMPL: 27.875 mV
MDC IDC SESS DTM: 20160113170058
MDC IDC SET LEADCHNL RA PACING AMPLITUDE: 1.5 V
MDC IDC SET LEADCHNL RV PACING AMPLITUDE: 2 V
MDC IDC STAT BRADY AS VS PERCENT: 0 %
MDC IDC STAT BRADY RV PERCENT PACED: 100 %
Zone Setting Detection Interval: 350 ms
Zone Setting Detection Interval: 400 ms

## 2014-06-03 NOTE — Progress Notes (Addendum)
Pacemaker remote check. Device function reviewed. Impedance, sensing, auto capture thresholds consistent with previous measurements. Histograms appropriate for patient and level of activity. All other diagnostic data reviewed and is appropriate and stable for patient. Real time/magnet EGM shows appropriate sensing and capture. 0.5% mode switch---longest 23min, max-A 171bpm. No ventricular high rate episodes. Estimated longevity 1yrs. [facility] 09/02/14 & ROV w/ SK/B 7/16.

## 2014-07-30 ENCOUNTER — Encounter: Payer: Self-pay | Admitting: Internal Medicine

## 2014-07-30 ENCOUNTER — Encounter: Payer: Self-pay | Admitting: *Deleted

## 2014-09-02 ENCOUNTER — Ambulatory Visit (INDEPENDENT_AMBULATORY_CARE_PROVIDER_SITE_OTHER): Payer: Medicare Other | Admitting: *Deleted

## 2014-09-02 ENCOUNTER — Encounter: Payer: Self-pay | Admitting: Internal Medicine

## 2014-09-02 DIAGNOSIS — I442 Atrioventricular block, complete: Secondary | ICD-10-CM

## 2014-09-02 LAB — MDC_IDC_ENUM_SESS_TYPE_REMOTE
Battery Remaining Longevity: 105 mo
Battery Voltage: 3.02 V
Brady Statistic AP VS Percent: 0 %
Brady Statistic RA Percent Paced: 34.8 %
Date Time Interrogation Session: 20160413155653
Lead Channel Impedance Value: 456 Ohm
Lead Channel Impedance Value: 456 Ohm
Lead Channel Pacing Threshold Amplitude: 0.625 V
Lead Channel Pacing Threshold Amplitude: 0.75 V
Lead Channel Pacing Threshold Pulse Width: 0.4 ms
Lead Channel Sensing Intrinsic Amplitude: 1 mV
Lead Channel Sensing Intrinsic Amplitude: 10.125 mV
Lead Channel Setting Pacing Amplitude: 1.5 V
Lead Channel Setting Pacing Amplitude: 2 V
Lead Channel Setting Pacing Pulse Width: 0.4 ms
Lead Channel Setting Sensing Sensitivity: 4 mV
MDC IDC MSMT LEADCHNL RA IMPEDANCE VALUE: 380 Ohm
MDC IDC MSMT LEADCHNL RA PACING THRESHOLD PULSEWIDTH: 0.4 ms
MDC IDC MSMT LEADCHNL RV IMPEDANCE VALUE: 532 Ohm
MDC IDC SET ZONE DETECTION INTERVAL: 400 ms
MDC IDC STAT BRADY AP VP PERCENT: 34.8 %
MDC IDC STAT BRADY AS VP PERCENT: 65.2 %
MDC IDC STAT BRADY AS VS PERCENT: 0 %
MDC IDC STAT BRADY RV PERCENT PACED: 100 %
Zone Setting Detection Interval: 350 ms

## 2014-09-03 NOTE — Progress Notes (Signed)
Remote pacemaker transmission.   

## 2014-09-12 NOTE — H&P (Signed)
PATIENT NAME:  Kevin Gibson, Kevin Gibson MR#:  [id number] DATE OF BIRTH:  1940/09/12  DATE OF ADMISSION:  10/08/2013  PRIMARY CARE PHYSICIAN: Dr. Frazier Richards   CARDIOLOGIST: Dr. Clayborn Bigness.   CHIEF COMPLAINT: Weakness.   HISTORY OF PRESENT ILLNESS: This is a 74 year old man, who went to his physician this morning for a regular check-up and found his blood sugar up and heart rate low and was sent in to the ER for further evaluation. He complains of feeling weak for the past 1-1/2 weeks. He gets these hot spasms. He vomited here in the Emergency Room. He was also having spasms in his legs and twitching in his feet. No complaints of chest pain or shortness of breath. In the ER, he was found to be in complete heart block with a heart rate in the 20s. Of note, the patient does take low-dose Coreg. Also, the patient was in hyperosmolar coma with a high sugar, low sodium, high potassium. Hospitalist services were contacted for further evaluation. ER physician spoke with Dr. Nehemiah Massed, cardiology, about the patient already.   PAST MEDICAL HISTORY: Diabetes and hypertension.   PAST SURGICAL HISTORY: None.   ALLERGIES: No known drug allergies.   MEDICATIONS: As per prescription writer include Coreg 3.125 mg twice a day, glipizide 10 mg twice a day. He states that he is taking 2 pills for a rash, but he cannot tell me what they are.   SOCIAL HISTORY: Quit smoking in 2001. No alcohol. No drug use. Retired Editor, commissioning.   FAMILY HISTORY: Father died at 29, probably smoking-related. Mother living at age 14 has diabetes.   REVIEW OF SYSTEMS: CONSTITUTIONAL: Positive for hot feeling. Positive for weight loss, 162 down to 142. Positive for weakness. EYES: No blurry vision. EARS, NOSE, MOUTH AND THROAT: Positive for dysphagia to liquids. CARDIOVASCULAR: No chest pain. No palpitations. RESPIRATORY: No shortness of breath. No cough. No sputum. No hemoptysis.  GASTROINTESTINAL: Positive for nausea and vomiting here  in the Emergency Room. No abdominal pain. No diarrhea. No constipation. No bright red blood per rectum. No melena.  GENITOURINARY: No burning on urination. No hematuria. MUSCULOSKELETAL: No joint pain or muscle pain. INTEGUMENTARY: Positive for rash on his arms. NEUROLOGIC: No fainting or blackouts, but twitching on his legs. PSYCHIATRIC: No anxiety or depression.  ENDOCRINE: No thyroid problems. HEMATOLOGIC AND LYMPHATIC: No anemia.   PHYSICAL EXAMINATION:  VITAL SIGNS: Temperature 97.4, pulse 22, respirations 16, blood pressure 150/60, pulse oximetry 100% on room air.  GENERAL: No respiratory distress, sitting up in bed talking with me.  EYES: Conjunctivae and lids normal. Pupils equal, round, and reactive to light. Extraocular muscles intact. No nystagmus. Ears, nose, mouth and throat: Tympanic membranes: No erythema. Nasal mucosa: No erythema. Throat: No erythema. No exudate seen. Lips and gums: No lesions.  NECK: No JVD. No bruits. No lymphadenopathy. No thyromegaly. No thyroid nodules palpated.  LUNGS: Clear to auscultation. No use of accessory muscles to breathe. No rhonchi, rales, or wheeze heard.  CARDIOVASCULAR: S1, S2 normal. No gallops, rubs, or murmurs heard. Carotid upstroke 2+ bilaterally. No bruits. Severely bradycardic. EXTREMITIES: Dorsalis pedis pulses 2+ bilaterally. No edema of the lower extremities.  ABDOMEN: Soft, nontender. No organomegaly/splenomegaly. Normoactive bowel sounds. No masses felt.  LYMPHATIC: No lymph nodes in the neck.  MUSCULOSKELETAL: No clubbing, edema or cyanosis.  SKIN: Slight rash on the arms.  NEUROLOGIC: Cranial nerves II through XII are grossly intact. Deep tendon reflexes 1+ bilaterally.  PSYCHIATRIC: The patient is oriented to person, place,  and time.    LABORATORY AND RADIOLOGICAL DATA: EKG shows complete heart block at 21 beats per minute and ventricular rhythm. Troponin negative. White blood cell count 11.9, hemoglobin and hematocrit 15.4 and  47.5, platelet count of 305. Glucose 897, BUN 56, creatinine at 2.71, sodium 118, potassium 6.1, chloride 84, CO2 of 22, calcium 10.4. Chest x-ray negative.   ASSESSMENT AND PLAN:  1.  Complete heart block. Emergency Room physician already spoke with Dr. Nehemiah Massed. Will have to admit to the intensive care unit. The patient has pacer pads on him already. Will hold Coreg at this time. Likely will need a pacemaker.  2.  Hyperosmolar coma. Emergency Room physician ordered an insulin drip. I will order stat intravenous insulin. We will have nursing change the insulin drip to the hyperglycemia protocol for non-diabetic ketoacidosis. Give intravenous fluid hydration.  3.  Hyperkalemia. Since the patient has complete heart block, I will give calcium gluconate for cardiac protection, intravenous insulin to lower the potassium and sodium bicarbonate. I will hold off on Kayexalate at this point. Recheck serial basic metabolic panels in a few hours. I think the main thing here will be intravenous fluid hydration. As the sugars come down, I think the potassium will also go down.  4.  Acute renal failure could be secondary to a few things, poor perfusion with the complete heart block, relative hypotension or the hyperosmolar coma and dehydration. Intravenous fluids given. A nephrology consult ordered.  5.  Severe hyponatremia. This is likely secondary to very elevated sugars. Will hydrate with normal saline at this point.   TIME SPENT ON ADMISSION: 55 minutes critical care time.   ____________________________ Tana Conch. Leslye Peer, MD rjw:aw D: 10/08/2013 12:54:42 ET T: 10/08/2013 13:11:22 ET JOB#: [id number]  cc: Tana Conch. Leslye Peer, MD, <Dictator> Ocie Cornfield. Ouida Sills, MD Dwayne D. Clayborn Bigness, MD Marisue Brooklyn MD ELECTRONICALLY SIGNED 10/09/2013 19:37

## 2014-09-12 NOTE — Discharge Summary (Signed)
PATIENT NAME:  Kevin Gibson, Kevin Gibson MR#:  [id number] DATE OF BIRTH:  20-Sep-1940  DATE OF ADMISSION:  10/08/2013 DATE OF DISCHARGE:  10/10/2013  DISCHARGE DIAGNOSES: 1.  Third degree heart block. 2.  Out of control type 2 diabetes.  3.  Hyperkalemia.  4.  Scrotal cellulitis.  5.  Accelerated hypertension.  DISCHARGE MEDICATIONS: Per [facility] med reconciliation. Basically, he will be on Levaquin 500 a day for 10 days, Humalog 25/75 insulin 20 units b.i.d. before his 2 large meals that he eats daily, losartan 100 mg a day for accelerated hypertension.    HISTORY AND PHYSICAL:  Please see detailed history and physical done on admission.   HOSPITAL COURSE: The patient was admitted with third degree heart block, out of control diabetes after having stopping his diabetic meds, thinking it was the cause of his syncope. The third degree heart block was seen intermittently on Holter monitor. 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 He did well with that, although he did have some hypertension. Losartan will be started. Some scrotal pain and mild swelling of the skin of the scrotum and possible cyst was noted. Levaquin will be given. He will be seen in the office early next week. Discussed this with cardiology consult. He will likely need a pacemaker given the bifascicular block with first degree AV block and intermittent third degree block, although after carvedilol he appears to be stable for now, so they wish to wait and do that early next week instead of trying to do that through the weekend.  That seems reasonable to me, so discharge will be set up. He did have right kidney atrophy on ultrasound. His creatinine is down to 1.77 today with IV fluids, etc. Again, will be seen soon in the office.   ____________________________ Ocie Cornfield. Ouida Sills, MD mwa:dmm D: 10/10/2013 08:12:48 ET T: 10/10/2013 09:41:44  ET JOB#: [id number]  cc: Ocie Cornfield. Ouida Sills, MD, <Dictator> Kirk Ruths MD ELECTRONICALLY SIGNED 10/14/2013 10:57

## 2014-09-12 NOTE — Consult Note (Signed)
PATIENT NAME:  Kevin Gibson, Kevin Gibson MR#:  [id number] DATE OF BIRTH:  January 23, 1941  DATE OF CONSULTATION:  10/08/2013  REFERRING PHYSICIAN:  Loletha Grayer, MD CONSULTING PHYSICIAN:  Corey Skains, MD  REASON FOR CONSULTATION:  Complete heart block with weakness and fatigue.   CHIEF COMPLAINT:  "I'm weak."   HISTORY OF PRESENT ILLNESS:  This is a 74 year old male with known hypertension on appropriate medication management including carvedilol and having shortness of breath, weakness and fatigue and diabetes, hypertension, hyperlipidemia and chronic kidney disease. The patient has had significant worsening diabetes and glucose control with an elevation of glucose above 500. The patient also had hyperkalemia and other significant electrolyte abnormalities. When seen in the Emergency Room, he is weak and fatigued with no evidence of chest pain, syncope, dizziness, nausea, diaphoresis and/or congestive heart failure-type symptoms. The patient had an EKG showing complete heart block with controlled ventricular rate of 38 beats per minute. There was no evidence of myocardial infarction with a normal troponin. The patient is comfortable with a blood pressure of 158/62.   The remainder of review of systems negative for vision change, ringing in the ears, hearing loss, cough, congestion, heartburn, nausea, vomiting, diarrhea, bloody stools, stomach pain, extremity pain, leg weakness, cramping of the buttocks, known blood clots, headaches, blackouts, dizzy spells, nosebleeds, congestion, trouble swallowing, frequent urination, urination at night, muscle weakness, numbness, anxiety, depression, skin lesions or skin rashes.   PAST MEDICAL HISTORY: 1.  Diabetes.  2.  Hypertension.  3.  Hyperlipidemia.  4.  Chronic kidney disease.   FAMILY HISTORY: No family members with early onset of cardiovascular disease or hypertension.   SOCIAL HISTORY:  Currently denies alcohol or tobacco use.   ALLERGIES:  As listed.    MEDICATIONS:  As listed.   PHYSICAL EXAMINATION: VITAL SIGNS: Blood pressure is 158/60, heart rate 40, upright and reclining, and slightly irregular.  GENERAL:  He is a well-appearing male in no acute distress.  HEAD, EYES, EARS, NOSE AND THROAT:  No icterus, thyromegaly, ulcers, hemorrhage or xanthelasma.  CARDIOVASCULAR:  Irregularly irregular with normal S1 and S2 with no murmur, gallop or rub. PMI is diffuse. Carotid upstroke normal without bruit. Jugular venous pressure is normal.  LUNGS:  Have a few basilar crackles with normal respirations.  ABDOMEN: Soft, nontender without hepatosplenomegaly or masses. Abdominal aorta is normal size without bruit.  EXTREMITIES: Show 2+ radial, femoral, dorsal pedal pulses, with no lower extremity edema, cyanosis, clubbing or ulcers.  NEUROLOGIC:  He is oriented to time, place and person with normal mood and affect.   ASSESSMENT: A 74 year old male with acute heart block, weakness and fatigue without evidence of myocardial infarction or congestive heart failure with severe hyperkalemia and glucose abnormalities needing further treatment.   RECOMMENDATIONS: 1.  Discontinuation of beta blocker.  2.  Electrolyte abnormality changes with lowering potassium with Lasix and intravenous fluids and following closely.  3. Telemetry for further risk reduction and/or assessment of heart block and further consideration of pacemaker placement if necessary.  4.   Echocardiogram for LV systolic dysfunction, valvular heart disease.  5.  Further treatment options after full treatment of kidney disease and other concerns listed above.    ____________________________ Corey Skains, MD bjk:dmm D: 10/08/2013 21:28:07 ET T: 10/08/2013 21:46:02 ET JOB#: [id number]  cc: Corey Skains, MD, <Dictator> Corey Skains MD ELECTRONICALLY SIGNED 10/08/2013 22:22

## 2014-10-09 ENCOUNTER — Encounter: Payer: Self-pay | Admitting: Cardiology

## 2014-12-02 ENCOUNTER — Encounter: Payer: Self-pay | Admitting: Internal Medicine

## 2014-12-08 ENCOUNTER — Encounter: Payer: Self-pay | Admitting: Internal Medicine

## 2014-12-15 ENCOUNTER — Ambulatory Visit (INDEPENDENT_AMBULATORY_CARE_PROVIDER_SITE_OTHER): Payer: Medicare Other | Admitting: Internal Medicine

## 2014-12-15 ENCOUNTER — Encounter: Payer: Self-pay | Admitting: Internal Medicine

## 2014-12-15 VITALS — BP 142/82 | HR 67 | Ht 67.0 in | Wt 160.5 lb

## 2014-12-15 DIAGNOSIS — Z950 Presence of cardiac pacemaker: Secondary | ICD-10-CM | POA: Diagnosis not present

## 2014-12-15 DIAGNOSIS — I442 Atrioventricular block, complete: Secondary | ICD-10-CM

## 2014-12-15 LAB — CUP PACEART INCLINIC DEVICE CHECK
Battery Remaining Longevity: 95 mo
Battery Voltage: 3.01 V
Brady Statistic AP VS Percent: 0 %
Brady Statistic AS VP Percent: 54.66 %
Brady Statistic AS VS Percent: 0 %
Brady Statistic RA Percent Paced: 45.34 %
Date Time Interrogation Session: 20160726120426
Lead Channel Impedance Value: 456 Ohm
Lead Channel Impedance Value: 532 Ohm
Lead Channel Pacing Threshold Pulse Width: 0.4 ms
Lead Channel Sensing Intrinsic Amplitude: 1.25 mV
Lead Channel Sensing Intrinsic Amplitude: 15.625 mV
Lead Channel Sensing Intrinsic Amplitude: 15.625 mV
Lead Channel Setting Sensing Sensitivity: 4 mV
MDC IDC MSMT LEADCHNL RA IMPEDANCE VALUE: 380 Ohm
MDC IDC MSMT LEADCHNL RA IMPEDANCE VALUE: 475 Ohm
MDC IDC MSMT LEADCHNL RA PACING THRESHOLD AMPLITUDE: 0.75 V
MDC IDC MSMT LEADCHNL RA PACING THRESHOLD PULSEWIDTH: 0.4 ms
MDC IDC MSMT LEADCHNL RA SENSING INTR AMPL: 1.25 mV
MDC IDC MSMT LEADCHNL RV PACING THRESHOLD AMPLITUDE: 0.625 V
MDC IDC SET LEADCHNL RA PACING AMPLITUDE: 1.5 V
MDC IDC SET LEADCHNL RV PACING AMPLITUDE: 2 V
MDC IDC SET LEADCHNL RV PACING PULSEWIDTH: 0.4 ms
MDC IDC SET ZONE DETECTION INTERVAL: 350 ms
MDC IDC STAT BRADY AP VP PERCENT: 45.34 %
MDC IDC STAT BRADY RV PERCENT PACED: 99.99 %
Zone Setting Detection Interval: 400 ms

## 2014-12-15 MED ORDER — AMLODIPINE BESYLATE 10 MG PO TABS: 10.0000 mg | ORAL_TABLET | Freq: Every day | ORAL | Status: DC

## 2014-12-15 NOTE — Progress Notes (Signed)
      Patient Care Team: Kirk Ruths, MD as PCP - General (Internal Medicine)   HPI  Kevin Gibson is a 74 y.o. male Seen in followup for syncope with complete heart block DOI 7/15  There is in fact improvement in functional status and no recurrent syncope.  His wife is concerned about the protruding of his device; there is no erythema or tenderness.    There has been no edema chest pain. he denies shortness of breath or palpitations   Past Medical History  Diagnosis Date  . Hypertension   . Diabetes mellitus without complication   . Hyperlipidemia   . Chronic kidney disease   . Third degree heart block   . RBBB   . Syncope and collapse   . Heart murmur   . Pacemaker     Past Surgical History  Procedure Laterality Date  . Pacemaker insertion    . Permanent pacemaker insertion N/A 11/26/2013    Procedure: PERMANENT PACEMAKER INSERTION;  Surgeon: Deboraha Sprang, MD;  Location: [facility] CATH LAB;  Service: Cardiovascular;  Laterality: N/A;    Current Outpatient Prescriptions  Medication Sig Dispense Refill  . aspirin EC 81 MG tablet Take 81 mg by mouth every other day.    . insulin NPH-regular Human (NOVOLIN 70/30) (70-30) 100 UNIT/ML injection Inject 20 Units into the skin 2 (two) times daily with a meal.    . labetalol (NORMODYNE) 200 MG tablet Take 2 tablets (400 mg total) by mouth 2 (two) times daily. 60 tablet 0  . lisinopril-hydrochlorothiazide (PRINZIDE,ZESTORETIC) 20-25 MG per tablet Take 1 tablet by mouth daily.    Marland Kitchen losartan (COZAAR) 100 MG tablet Take 100 mg by mouth daily.     No current facility-administered medications for this visit.    No Known Allergies  Review of Systems negative except from HPI and PMH  Physical Exam BP 142/82 mmHg  Pulse 67  Ht 5\' 7"  (1.702 m)  Wt 160 lb 8 oz (72.802 kg)  BMI 25.13 kg/m2 Well developed and well nourished in no acute distress HENT normal E scleral and icterus clear Neck Supple JVP flat; carotids brisk  and full Clear to ausculation Device pocket well healed; without hematoma or erythema.  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  I will take the liberty of stopping the former and continuing the latter; for blood pressure control we will add amlodipine 10 mg daily. He has not had a problem with edema  Device has been erroneously identified atrial fibrillation which is far field  Device dependent  He is to see Dr. Ouida Sills in just a few weeks.

## 2014-12-15 NOTE — Patient Instructions (Signed)
Medication Instructions:  Your physician has recommended you make the following change in your medication:  STOP taking lisinopril-HCTZ START taking amlodipine 10mg  once per day   Labwork: none  Testing/Procedures: Remote monitoring is used to monitor your Pacemaker of ICD from home. This monitoring reduces the number of office visits required to check your device to one time per year. It allows [place] to keep an eye on the functioning of your device to ensure it is working properly. You are scheduled for a device check from home on October 25. You may send your transmission at any time that day. If you have a wireless device, the transmission will be sent automatically. After your physician reviews your transmission, you will receive a postcard with your next transmission date.    Follow-Up: Your physician wants you to follow-up in: one year with Dr. Caryl Comes.  You will receive a reminder letter in the mail two months in advance. If you don't receive a letter, please call our office to schedule the follow-up appointment.   Any Other Special Instructions Will Be Listed Below (If Applicable).

## 2015-01-12 ENCOUNTER — Encounter: Payer: Self-pay | Admitting: Internal Medicine

## 2015-03-16 ENCOUNTER — Telehealth: Payer: Self-pay | Admitting: *Deleted

## 2015-03-16 ENCOUNTER — Ambulatory Visit (INDEPENDENT_AMBULATORY_CARE_PROVIDER_SITE_OTHER): Payer: Medicare Other | Admitting: *Deleted

## 2015-03-16 DIAGNOSIS — I442 Atrioventricular block, complete: Secondary | ICD-10-CM

## 2015-03-16 NOTE — Progress Notes (Signed)
Remote pacemaker transmission.   

## 2015-03-23 LAB — CUP PACEART REMOTE DEVICE CHECK
Battery Remaining Longevity: 94 mo
Battery Voltage: 3.01 V
Brady Statistic AS VP Percent: 43.02 %
Brady Statistic AS VS Percent: 0.03 %
Brady Statistic RA Percent Paced: 56.95 %
Implantable Lead Implant Date: 20150708
Implantable Lead Location: 753860
Implantable Lead Model: 5076
Lead Channel Impedance Value: 418 Ohm
Lead Channel Impedance Value: 475 Ohm
Lead Channel Pacing Threshold Amplitude: 0.625 V
Lead Channel Pacing Threshold Amplitude: 0.75 V
Lead Channel Pacing Threshold Pulse Width: 0.4 ms
Lead Channel Pacing Threshold Pulse Width: 0.4 ms
Lead Channel Sensing Intrinsic Amplitude: 1.25 mV
Lead Channel Sensing Intrinsic Amplitude: 10.75 mV
Lead Channel Setting Pacing Pulse Width: 0.4 ms
MDC IDC LEAD IMPLANT DT: 20150708
MDC IDC LEAD LOCATION: 753859
MDC IDC MSMT LEADCHNL RA SENSING INTR AMPL: 1.25 mV
MDC IDC MSMT LEADCHNL RV IMPEDANCE VALUE: 456 Ohm
MDC IDC MSMT LEADCHNL RV IMPEDANCE VALUE: 532 Ohm
MDC IDC MSMT LEADCHNL RV SENSING INTR AMPL: 10.75 mV
MDC IDC SESS DTM: 20161025175652
MDC IDC SET LEADCHNL RA PACING AMPLITUDE: 1.5 V
MDC IDC SET LEADCHNL RV PACING AMPLITUDE: 2 V
MDC IDC SET LEADCHNL RV SENSING SENSITIVITY: 4 mV
MDC IDC STAT BRADY AP VP PERCENT: 56.94 %
MDC IDC STAT BRADY AP VS PERCENT: 0.01 %
MDC IDC STAT BRADY RV PERCENT PACED: 99.96 %

## 2015-03-24 ENCOUNTER — Encounter: Payer: Self-pay | Admitting: Cardiology

## 2015-05-26 NOTE — Telephone Encounter (Signed)
LMOVM TO SEND IN TRANSMISSON FOR PPM DEIVCE

## 2015-06-15 ENCOUNTER — Telehealth: Payer: Self-pay | Admitting: Cardiology

## 2015-06-15 ENCOUNTER — Ambulatory Visit (INDEPENDENT_AMBULATORY_CARE_PROVIDER_SITE_OTHER): Payer: Medicare Other | Admitting: *Deleted

## 2015-06-15 DIAGNOSIS — I442 Atrioventricular block, complete: Secondary | ICD-10-CM

## 2015-06-15 NOTE — Telephone Encounter (Signed)
LMOVM reminding pt to send remote transmission.   

## 2015-06-16 NOTE — Progress Notes (Signed)
Remote pacemaker transmission.   

## 2015-06-29 LAB — CUP PACEART REMOTE DEVICE CHECK
Battery Voltage: 3.01 V
Brady Statistic AP VP Percent: 51.38 %
Brady Statistic AP VS Percent: 0 %
Brady Statistic AS VP Percent: 48.6 %
Brady Statistic AS VS Percent: 0.02 %
Implantable Lead Implant Date: 20150708
Implantable Lead Implant Date: 20150708
Implantable Lead Location: 753859
Lead Channel Impedance Value: 361 Ohm
Lead Channel Impedance Value: 418 Ohm
Lead Channel Impedance Value: 456 Ohm
Lead Channel Pacing Threshold Amplitude: 0.625 V
Lead Channel Pacing Threshold Amplitude: 0.875 V
Lead Channel Pacing Threshold Pulse Width: 0.4 ms
Lead Channel Sensing Intrinsic Amplitude: 12 mV
Lead Channel Sensing Intrinsic Amplitude: 12 mV
Lead Channel Setting Pacing Amplitude: 1.5 V
Lead Channel Setting Pacing Amplitude: 2 V
Lead Channel Setting Pacing Pulse Width: 0.4 ms
MDC IDC LEAD LOCATION: 753860
MDC IDC MSMT BATTERY REMAINING LONGEVITY: 92 mo
MDC IDC MSMT LEADCHNL RA PACING THRESHOLD PULSEWIDTH: 0.4 ms
MDC IDC MSMT LEADCHNL RA SENSING INTR AMPL: 1.375 mV
MDC IDC MSMT LEADCHNL RA SENSING INTR AMPL: 1.375 mV
MDC IDC MSMT LEADCHNL RV IMPEDANCE VALUE: 475 Ohm
MDC IDC SESS DTM: 20170124200621
MDC IDC SET LEADCHNL RV SENSING SENSITIVITY: 4 mV
MDC IDC STAT BRADY RA PERCENT PACED: 51.38 %
MDC IDC STAT BRADY RV PERCENT PACED: 99.98 %

## 2015-07-02 ENCOUNTER — Encounter: Payer: Self-pay | Admitting: Cardiology

## 2015-09-14 ENCOUNTER — Ambulatory Visit (INDEPENDENT_AMBULATORY_CARE_PROVIDER_SITE_OTHER): Payer: Medicare Other | Admitting: *Deleted

## 2015-09-14 ENCOUNTER — Telehealth: Payer: Self-pay | Admitting: Cardiology

## 2015-09-14 DIAGNOSIS — I442 Atrioventricular block, complete: Secondary | ICD-10-CM

## 2015-09-14 DIAGNOSIS — Z950 Presence of cardiac pacemaker: Secondary | ICD-10-CM | POA: Diagnosis not present

## 2015-09-14 NOTE — Progress Notes (Signed)
Remote pacemaker transmission.   

## 2015-09-14 NOTE — Telephone Encounter (Signed)
Attempted to confirm remote transmission with pt. No answer and was unable to leave a message.   

## 2015-10-21 LAB — CUP PACEART REMOTE DEVICE CHECK
Battery Remaining Longevity: 86 mo
Brady Statistic AP VS Percent: 0 %
Brady Statistic AS VS Percent: 0 %
Implantable Lead Implant Date: 20150708
Implantable Lead Model: 5076
Lead Channel Impedance Value: 361 Ohm
Lead Channel Impedance Value: 418 Ohm
Lead Channel Impedance Value: 456 Ohm
Lead Channel Pacing Threshold Amplitude: 0.875 V
Lead Channel Pacing Threshold Pulse Width: 0.4 ms
Lead Channel Sensing Intrinsic Amplitude: 0.875 mV
Lead Channel Sensing Intrinsic Amplitude: 0.875 mV
Lead Channel Sensing Intrinsic Amplitude: 12 mV
Lead Channel Setting Pacing Amplitude: 2 V
Lead Channel Setting Sensing Sensitivity: 4 mV
MDC IDC LEAD IMPLANT DT: 20150708
MDC IDC LEAD LOCATION: 753859
MDC IDC LEAD LOCATION: 753860
MDC IDC MSMT BATTERY VOLTAGE: 3 V
MDC IDC MSMT LEADCHNL RA PACING THRESHOLD AMPLITUDE: 0.625 V
MDC IDC MSMT LEADCHNL RA PACING THRESHOLD PULSEWIDTH: 0.4 ms
MDC IDC MSMT LEADCHNL RV IMPEDANCE VALUE: 399 Ohm
MDC IDC MSMT LEADCHNL RV SENSING INTR AMPL: 12 mV
MDC IDC SESS DTM: 20170425155846
MDC IDC SET LEADCHNL RA PACING AMPLITUDE: 1.5 V
MDC IDC SET LEADCHNL RV PACING PULSEWIDTH: 0.4 ms
MDC IDC STAT BRADY AP VP PERCENT: 68.15 %
MDC IDC STAT BRADY AS VP PERCENT: 31.85 %
MDC IDC STAT BRADY RA PERCENT PACED: 68.15 %
MDC IDC STAT BRADY RV PERCENT PACED: 100 %

## 2015-10-22 ENCOUNTER — Encounter: Payer: Self-pay | Admitting: Cardiology

## 2015-12-24 ENCOUNTER — Other Ambulatory Visit: Payer: Self-pay | Admitting: Internal Medicine

## 2015-12-28 ENCOUNTER — Encounter: Payer: Self-pay | Admitting: Internal Medicine

## 2015-12-28 ENCOUNTER — Ambulatory Visit (INDEPENDENT_AMBULATORY_CARE_PROVIDER_SITE_OTHER): Payer: Medicare Other | Admitting: Internal Medicine

## 2015-12-28 ENCOUNTER — Encounter (INDEPENDENT_AMBULATORY_CARE_PROVIDER_SITE_OTHER): Payer: Self-pay

## 2015-12-28 VITALS — BP 100/58 | HR 72 | Ht 67.0 in | Wt 158.0 lb

## 2015-12-28 DIAGNOSIS — E1159 Type 2 diabetes mellitus with other circulatory complications: Secondary | ICD-10-CM | POA: Diagnosis not present

## 2015-12-28 DIAGNOSIS — I152 Hypertension secondary to endocrine disorders: Secondary | ICD-10-CM

## 2015-12-28 DIAGNOSIS — I442 Atrioventricular block, complete: Secondary | ICD-10-CM | POA: Diagnosis not present

## 2015-12-28 DIAGNOSIS — I10 Essential (primary) hypertension: Secondary | ICD-10-CM

## 2015-12-28 DIAGNOSIS — Z950 Presence of cardiac pacemaker: Secondary | ICD-10-CM

## 2015-12-28 NOTE — Progress Notes (Signed)
      Patient Care Team: Kirk Ruths, MD as PCP - General (Internal Medicine)   HPI  Kevin Gibson is a 75 y.o. male Seen in followup for syncope with complete heart block DOI 7/15  There is in fact improvement in functional status and no recurrent syncope.   There has been no edema chest pain. he denies shortness of breath or palpitations   Saw Dr MA 6/17 notes and labs reviewed  7/15 Echo  EF 45% 7/15 Myoview EF 73% no ischemia  6/17 creatinine 2.0 K3.9  Past Medical History:  Diagnosis Date  . Chronic kidney disease   . Diabetes mellitus without complication ([place])   . Heart murmur   . Hyperlipidemia   . Hypertension   . Pacemaker   . RBBB   . Syncope and collapse   . Third degree heart block [facility])     Past Surgical History:  Procedure Laterality Date  . PACEMAKER INSERTION    . PERMANENT PACEMAKER INSERTION N/A 11/26/2013   Procedure: PERMANENT PACEMAKER INSERTION;  Surgeon: Deboraha Sprang, MD;  Location: [facility] CATH LAB;  Service: Cardiovascular;  Laterality: N/A;    Current Outpatient Prescriptions  Medication Sig Dispense Refill  . amLODipine (NORVASC) 10 MG tablet TAKE ONE TABLET BY MOUTH ONCE DAILY 30 tablet 0  . aspirin EC 81 MG tablet Take 81 mg by mouth every other day.    . insulin NPH-regular Human (NOVOLIN 70/30) (70-30) 100 UNIT/ML injection Inject 20 Units into the skin 2 (two) times daily with a meal.    . labetalol (NORMODYNE) 200 MG tablet Take 2 tablets (400 mg total) by mouth 2 (two) times daily. 60 tablet 0   No current facility-administered medications for this visit.     No Known Allergies  Review of Systems negative except from HPI and PMH  Physical Exam BP (!) 100/58 (BP Location: Left Arm, Patient Position: Sitting, Cuff Size: Normal)   Pulse 72   Ht 5\' 7"  (1.702 m)   Wt 158 lb (71.7 kg)   BMI 24.75 kg/m  Well developed and well nourished in no acute distress HENT normal E scleral and icterus clear Neck Supple JVP  flat; carotids brisk and full Clear to ausculation Device pocket well healed; without hematoma or erythema.  There is no tethering   Regular rate and rhythm, no murmurs gallops or rub Soft with active bowel sounds No clubbing cyanosis  Edema Alert and oriented, grossly normal motor and sensory function Skin Warm and Dry  ECG demonstrates ventricular pacing  Assessment and  Plan  Complete heart block  Hypertension  Pacemaker-Medtronic The patient's device was interrogated.  The information was reviewed. No changes were made in the programming.     Syncope  Renal failure grade 3-4  CR 2.0  6/17   Atrial oversensing with false Afib     Blood pressure is much improved   NO interval syncope

## 2015-12-28 NOTE — Patient Instructions (Signed)
Medication Instructions: - Your physician recommends that you continue on your current medications as directed. Please refer to the Current Medication list given to you today.  Labwork: - none  Procedures/Testing: - none  Follow-Up: - Remote monitoring is used to monitor your Pacemaker of ICD from home. This monitoring reduces the number of office visits required to check your device to one time per year. It allows [place] to keep an eye on the functioning of your device to ensure it is working properly. You are scheduled for a device check from home on 03/28/16. You may send your transmission at any time that day. If you have a wireless device, the transmission will be sent automatically. After your physician reviews your transmission, you will receive a postcard with your next transmission date.  - Your physician wants you to follow-up in: 1 year with Dr. Caryl Comes. You will receive a reminder letter in the mail two months in advance. If you don't receive a letter, please call our office to schedule the follow-up appointment.  Any Additional Special Instructions Will Be Listed Below (If Applicable).     If you need a refill on your cardiac medications before your next appointment, please call your pharmacy.

## 2016-01-03 ENCOUNTER — Encounter: Payer: Self-pay | Admitting: Internal Medicine

## 2016-01-27 LAB — CUP PACEART INCLINIC DEVICE CHECK
Date Time Interrogation Session: 20170907175007
Implantable Lead Implant Date: 20150708
MDC IDC LEAD IMPLANT DT: 20150708
MDC IDC LEAD LOCATION: 753859
MDC IDC LEAD LOCATION: 753860

## 2016-02-15 ENCOUNTER — Other Ambulatory Visit: Payer: Self-pay | Admitting: Internal Medicine

## 2016-03-28 ENCOUNTER — Ambulatory Visit (INDEPENDENT_AMBULATORY_CARE_PROVIDER_SITE_OTHER): Payer: Medicare Other | Admitting: *Deleted

## 2016-03-28 ENCOUNTER — Telehealth: Payer: Self-pay | Admitting: Cardiology

## 2016-03-28 DIAGNOSIS — I442 Atrioventricular block, complete: Secondary | ICD-10-CM | POA: Diagnosis not present

## 2016-03-28 NOTE — Telephone Encounter (Signed)
Spoke with pt and reminded pt of remote transmission that is due today. Pt verbalized understanding.   

## 2016-03-28 NOTE — Progress Notes (Signed)
Remote pacemaker transmission.   

## 2016-03-29 ENCOUNTER — Encounter: Payer: Self-pay | Admitting: Cardiology

## 2016-04-12 ENCOUNTER — Encounter: Payer: Self-pay | Admitting: Cardiology

## 2016-04-25 LAB — CUP PACEART REMOTE DEVICE CHECK
Brady Statistic AP VP Percent: 74.56 %
Brady Statistic AP VS Percent: 0 %
Brady Statistic AS VP Percent: 25.44 %
Implantable Lead Implant Date: 20150708
Implantable Lead Location: 753859
Implantable Lead Model: 5076
Implantable Lead Model: 5076
Lead Channel Impedance Value: 361 Ohm
Lead Channel Impedance Value: 399 Ohm
Lead Channel Impedance Value: 418 Ohm
Lead Channel Pacing Threshold Pulse Width: 0.4 ms
Lead Channel Sensing Intrinsic Amplitude: 1.25 mV
Lead Channel Sensing Intrinsic Amplitude: 12 mV
Lead Channel Sensing Intrinsic Amplitude: 12 mV
Lead Channel Setting Pacing Amplitude: 1.5 V
Lead Channel Setting Pacing Amplitude: 2 V
Lead Channel Setting Pacing Pulse Width: 0.4 ms
MDC IDC LEAD IMPLANT DT: 20150708
MDC IDC LEAD LOCATION: 753860
MDC IDC MSMT BATTERY REMAINING LONGEVITY: 76 mo
MDC IDC MSMT BATTERY VOLTAGE: 3 V
MDC IDC MSMT LEADCHNL RA PACING THRESHOLD AMPLITUDE: 0.75 V
MDC IDC MSMT LEADCHNL RA PACING THRESHOLD PULSEWIDTH: 0.4 ms
MDC IDC MSMT LEADCHNL RA SENSING INTR AMPL: 1.25 mV
MDC IDC MSMT LEADCHNL RV IMPEDANCE VALUE: 456 Ohm
MDC IDC MSMT LEADCHNL RV PACING THRESHOLD AMPLITUDE: 0.625 V
MDC IDC PG IMPLANT DT: 20150708
MDC IDC SESS DTM: 20171107183320
MDC IDC SET LEADCHNL RV SENSING SENSITIVITY: 4 mV
MDC IDC STAT BRADY AS VS PERCENT: 0 %
MDC IDC STAT BRADY RA PERCENT PACED: 74.56 %
MDC IDC STAT BRADY RV PERCENT PACED: 100 %

## 2016-06-27 ENCOUNTER — Ambulatory Visit (INDEPENDENT_AMBULATORY_CARE_PROVIDER_SITE_OTHER): Payer: Medicare Other | Admitting: *Deleted

## 2016-06-27 ENCOUNTER — Telehealth: Payer: Self-pay | Admitting: Cardiology

## 2016-06-27 DIAGNOSIS — I442 Atrioventricular block, complete: Secondary | ICD-10-CM

## 2016-06-27 NOTE — Progress Notes (Signed)
Remote pacemaker transmission.   

## 2016-06-27 NOTE — Telephone Encounter (Signed)
LMOVM reminding pt to send remote transmission.   

## 2016-06-28 LAB — CUP PACEART REMOTE DEVICE CHECK
Battery Voltage: 3 V
Brady Statistic AP VP Percent: 55.37 %
Brady Statistic AP VS Percent: 0.01 %
Brady Statistic AS VS Percent: 0.05 %
Brady Statistic RV Percent Paced: 99.79 %
Implantable Lead Implant Date: 20150708
Implantable Lead Location: 753859
Implantable Lead Model: 5076
Implantable Pulse Generator Implant Date: 20150708
Lead Channel Impedance Value: 380 Ohm
Lead Channel Impedance Value: 418 Ohm
Lead Channel Impedance Value: 437 Ohm
Lead Channel Pacing Threshold Amplitude: 0.75 V
Lead Channel Pacing Threshold Amplitude: 0.75 V
Lead Channel Pacing Threshold Pulse Width: 0.4 ms
Lead Channel Sensing Intrinsic Amplitude: 1.5 mV
Lead Channel Sensing Intrinsic Amplitude: 15.75 mV
Lead Channel Setting Pacing Amplitude: 1.5 V
Lead Channel Setting Pacing Amplitude: 2 V
MDC IDC LEAD IMPLANT DT: 20150708
MDC IDC LEAD LOCATION: 753860
MDC IDC MSMT BATTERY REMAINING LONGEVITY: 75 mo
MDC IDC MSMT LEADCHNL RA PACING THRESHOLD PULSEWIDTH: 0.4 ms
MDC IDC MSMT LEADCHNL RA SENSING INTR AMPL: 1.5 mV
MDC IDC MSMT LEADCHNL RV IMPEDANCE VALUE: 475 Ohm
MDC IDC MSMT LEADCHNL RV SENSING INTR AMPL: 15.75 mV
MDC IDC SESS DTM: 20180206200533
MDC IDC SET LEADCHNL RV PACING PULSEWIDTH: 0.4 ms
MDC IDC SET LEADCHNL RV SENSING SENSITIVITY: 4 mV
MDC IDC STAT BRADY AS VP PERCENT: 44.57 %
MDC IDC STAT BRADY RA PERCENT PACED: 55.28 %

## 2016-06-30 ENCOUNTER — Encounter: Payer: Self-pay | Admitting: Cardiology

## 2016-07-14 ENCOUNTER — Encounter: Payer: Self-pay | Admitting: Cardiology

## 2016-09-26 ENCOUNTER — Ambulatory Visit (INDEPENDENT_AMBULATORY_CARE_PROVIDER_SITE_OTHER): Payer: Medicare Other | Admitting: *Deleted

## 2016-09-26 DIAGNOSIS — I442 Atrioventricular block, complete: Secondary | ICD-10-CM | POA: Diagnosis not present

## 2016-09-26 NOTE — Progress Notes (Signed)
Remote pacemaker transmission.   

## 2016-09-28 LAB — CUP PACEART REMOTE DEVICE CHECK
Battery Remaining Longevity: 73 mo
Brady Statistic RA Percent Paced: 40.82 %
Date Time Interrogation Session: 20180508153241
Implantable Lead Implant Date: 20150708
Implantable Lead Location: 753860
Implantable Lead Model: 5076
Lead Channel Pacing Threshold Amplitude: 0.625 V
Lead Channel Pacing Threshold Pulse Width: 0.4 ms
Lead Channel Sensing Intrinsic Amplitude: 1 mV
Lead Channel Sensing Intrinsic Amplitude: 1 mV
MDC IDC LEAD IMPLANT DT: 20150708
MDC IDC LEAD LOCATION: 753859
MDC IDC MSMT BATTERY VOLTAGE: 3 V
MDC IDC MSMT LEADCHNL RA IMPEDANCE VALUE: 361 Ohm
MDC IDC MSMT LEADCHNL RA IMPEDANCE VALUE: 418 Ohm
MDC IDC MSMT LEADCHNL RV IMPEDANCE VALUE: 380 Ohm
MDC IDC MSMT LEADCHNL RV IMPEDANCE VALUE: 456 Ohm
MDC IDC MSMT LEADCHNL RV PACING THRESHOLD AMPLITUDE: 0.75 V
MDC IDC MSMT LEADCHNL RV PACING THRESHOLD PULSEWIDTH: 0.4 ms
MDC IDC MSMT LEADCHNL RV SENSING INTR AMPL: 14.5 mV
MDC IDC MSMT LEADCHNL RV SENSING INTR AMPL: 14.5 mV
MDC IDC PG IMPLANT DT: 20150708
MDC IDC SET LEADCHNL RA PACING AMPLITUDE: 1.5 V
MDC IDC SET LEADCHNL RV PACING AMPLITUDE: 2 V
MDC IDC SET LEADCHNL RV PACING PULSEWIDTH: 0.4 ms
MDC IDC SET LEADCHNL RV SENSING SENSITIVITY: 4 mV
MDC IDC STAT BRADY AP VP PERCENT: 40.85 %
MDC IDC STAT BRADY AP VS PERCENT: 0 %
MDC IDC STAT BRADY AS VP PERCENT: 59.14 %
MDC IDC STAT BRADY AS VS PERCENT: 0.01 %
MDC IDC STAT BRADY RV PERCENT PACED: 99.98 %

## 2016-12-26 ENCOUNTER — Telehealth: Payer: Self-pay | Admitting: Cardiology

## 2016-12-26 ENCOUNTER — Encounter: Payer: Medicare Other | Admitting: *Deleted

## 2016-12-26 NOTE — Telephone Encounter (Signed)
LMOVM reminding pt to send remote transmission.   

## 2016-12-27 ENCOUNTER — Encounter: Payer: Self-pay | Admitting: Cardiology

## 2017-01-01 ENCOUNTER — Ambulatory Visit (INDEPENDENT_AMBULATORY_CARE_PROVIDER_SITE_OTHER): Payer: Medicare Other | Admitting: *Deleted

## 2017-01-01 DIAGNOSIS — Z950 Presence of cardiac pacemaker: Secondary | ICD-10-CM

## 2017-01-01 DIAGNOSIS — I442 Atrioventricular block, complete: Secondary | ICD-10-CM

## 2017-01-04 NOTE — Progress Notes (Signed)
Remote pacemaker transmission.   

## 2017-01-10 LAB — CUP PACEART REMOTE DEVICE CHECK
Brady Statistic AP VP Percent: 29.9 %
Brady Statistic AP VS Percent: 0 %
Brady Statistic AS VP Percent: 70.1 %
Brady Statistic RV Percent Paced: 99.91 %
Date Time Interrogation Session: 20180813191852
Implantable Lead Location: 753859
Implantable Lead Model: 5076
Implantable Lead Model: 5076
Lead Channel Impedance Value: 437 Ohm
Lead Channel Sensing Intrinsic Amplitude: 1.5 mV
Lead Channel Sensing Intrinsic Amplitude: 14.5 mV
Lead Channel Sensing Intrinsic Amplitude: 14.5 mV
Lead Channel Setting Pacing Amplitude: 1.5 V
Lead Channel Setting Pacing Amplitude: 2 V
Lead Channel Setting Pacing Pulse Width: 0.4 ms
MDC IDC LEAD IMPLANT DT: 20150708
MDC IDC LEAD IMPLANT DT: 20150708
MDC IDC LEAD LOCATION: 753860
MDC IDC MSMT BATTERY REMAINING LONGEVITY: 69 mo
MDC IDC MSMT BATTERY VOLTAGE: 3 V
MDC IDC MSMT LEADCHNL RA IMPEDANCE VALUE: 418 Ohm
MDC IDC MSMT LEADCHNL RA IMPEDANCE VALUE: 475 Ohm
MDC IDC MSMT LEADCHNL RA PACING THRESHOLD AMPLITUDE: 0.75 V
MDC IDC MSMT LEADCHNL RA PACING THRESHOLD PULSEWIDTH: 0.4 ms
MDC IDC MSMT LEADCHNL RA SENSING INTR AMPL: 1.5 mV
MDC IDC MSMT LEADCHNL RV IMPEDANCE VALUE: 494 Ohm
MDC IDC MSMT LEADCHNL RV PACING THRESHOLD AMPLITUDE: 0.625 V
MDC IDC MSMT LEADCHNL RV PACING THRESHOLD PULSEWIDTH: 0.4 ms
MDC IDC PG IMPLANT DT: 20150708
MDC IDC SET LEADCHNL RV SENSING SENSITIVITY: 4 mV
MDC IDC STAT BRADY AS VS PERCENT: 0 %
MDC IDC STAT BRADY RA PERCENT PACED: 29.86 %

## 2017-01-11 ENCOUNTER — Encounter: Payer: Self-pay | Admitting: Cardiology

## 2017-01-23 ENCOUNTER — Encounter: Payer: Self-pay | Admitting: Internal Medicine

## 2017-01-23 ENCOUNTER — Ambulatory Visit (INDEPENDENT_AMBULATORY_CARE_PROVIDER_SITE_OTHER): Payer: Medicare Other | Admitting: Internal Medicine

## 2017-01-23 VITALS — BP 140/90 | HR 76 | Ht 67.0 in | Wt 139.5 lb

## 2017-01-23 DIAGNOSIS — Z950 Presence of cardiac pacemaker: Secondary | ICD-10-CM | POA: Diagnosis not present

## 2017-01-23 DIAGNOSIS — I442 Atrioventricular block, complete: Secondary | ICD-10-CM

## 2017-01-23 DIAGNOSIS — I10 Essential (primary) hypertension: Secondary | ICD-10-CM

## 2017-01-23 LAB — CUP PACEART INCLINIC DEVICE CHECK
Battery Voltage: 3 V
Brady Statistic AP VP Percent: 51.8 %
Brady Statistic AS VP Percent: 48.19 %
Brady Statistic RA Percent Paced: 51.77 %
Implantable Lead Implant Date: 20150708
Implantable Lead Location: 753859
Implantable Lead Model: 5076
Implantable Lead Model: 5076
Lead Channel Impedance Value: 437 Ohm
Lead Channel Pacing Threshold Amplitude: 0.75 V
Lead Channel Pacing Threshold Pulse Width: 0.4 ms
Lead Channel Sensing Intrinsic Amplitude: 1.875 mV
Lead Channel Setting Pacing Amplitude: 2.5 V
Lead Channel Setting Pacing Pulse Width: 0.4 ms
Lead Channel Setting Sensing Sensitivity: 4 mV
MDC IDC LEAD IMPLANT DT: 20150708
MDC IDC LEAD LOCATION: 753860
MDC IDC MSMT BATTERY REMAINING LONGEVITY: 63 mo
MDC IDC MSMT LEADCHNL RA IMPEDANCE VALUE: 418 Ohm
MDC IDC MSMT LEADCHNL RA IMPEDANCE VALUE: 475 Ohm
MDC IDC MSMT LEADCHNL RA PACING THRESHOLD PULSEWIDTH: 0.4 ms
MDC IDC MSMT LEADCHNL RV IMPEDANCE VALUE: 513 Ohm
MDC IDC MSMT LEADCHNL RV PACING THRESHOLD AMPLITUDE: 0.75 V
MDC IDC MSMT LEADCHNL RV SENSING INTR AMPL: 14.5 mV
MDC IDC PG IMPLANT DT: 20150708
MDC IDC SESS DTM: 20180904143654
MDC IDC SET LEADCHNL RA PACING AMPLITUDE: 2 V
MDC IDC STAT BRADY AP VS PERCENT: 0 %
MDC IDC STAT BRADY AS VS PERCENT: 0.01 %
MDC IDC STAT BRADY RV PERCENT PACED: 99.95 %

## 2017-01-23 NOTE — Patient Instructions (Signed)
Medication Instructions: - Your physician recommends that you continue on your current medications as directed. Please refer to the Current Medication list given to you today.  Labwork: - none ordered  Procedures/Testing: - none ordered  Follow-Up: - Remote monitoring is used to monitor your Pacemaker of ICD from home. This monitoring reduces the number of office visits required to check your device to one time per year. It allows [place] to keep an eye on the functioning of your device to ensure it is working properly. You are scheduled for a device check from home on 04/02/17. You may send your transmission at any time that day. If you have a wireless device, the transmission will be sent automatically. After your physician reviews your transmission, you will receive a postcard with your next transmission date.  - Your physician wants you to follow-up in: 1 year with Dr. Caryl Comes. You will receive a reminder letter in the mail two months in advance. If you don't receive a letter, please call our office to schedule the follow-up appointment.   Any Additional Special Instructions Will Be Listed Below (If Applicable).     If you need a refill on your cardiac medications before your next appointment, please call your pharmacy.

## 2017-01-23 NOTE — Progress Notes (Signed)
      Patient Care Team: Kirk Ruths, MD as PCP - General (Internal Medicine)   HPI  Kevin Gibson is a 76 y.o. male Seen in followup for syncope with complete heart block pacemaker Medtronic DOI 7/15   7/15 Echo  EF 45% 7/15 Myoview EF 73% no ischemia  6/17 creatinine 2.0 K3.9 Date Cr K  6/17 2.0 3.9  6/18 1.8 4.2   The patient denies chest pain, shortness of breath, nocturnal dyspnea, orthopnea or peripheral edema.  There have been no palpitations, lightheadedness or syncope.  [place] 82 yo [place]   Past Medical History:  Diagnosis Date  . Chronic kidney disease   . Diabetes mellitus without complication ([place])   . Heart murmur   . Hyperlipidemia   . Hypertension   . Pacemaker   . RBBB   . Syncope and collapse   . Third degree heart block [facility])     Past Surgical History:  Procedure Laterality Date  . PACEMAKER INSERTION    . PERMANENT PACEMAKER INSERTION N/A 11/26/2013   Procedure: PERMANENT PACEMAKER INSERTION;  Surgeon: Deboraha Sprang, MD;  Location: [facility] CATH LAB;  Service: Cardiovascular;  Laterality: N/A;    Current Outpatient Prescriptions  Medication Sig Dispense Refill  . amLODipine (NORVASC) 10 MG tablet TAKE ONE TABLET BY MOUTH ONCE DAILY 30 tablet 3  . aspirin EC 81 MG tablet Take 81 mg by mouth every other day.    . labetalol (NORMODYNE) 200 MG tablet Take 2 tablets (400 mg total) by mouth 2 (two) times daily. 60 tablet 0   No current facility-administered medications for this visit.     No Known Allergies  Review of Systems negative except from HPI and PMH  Physical Exam BP 140/90 (BP Location: Left Arm, Patient Position: Sitting, Cuff Size: Normal)   Pulse 76   Ht 5\' 7"  (1.702 m)   Wt 139 lb 8 oz (63.3 kg)   BMI 21.85 kg/m  Well developed and nourished in no acute distress HENT normal Neck supple with JVP-flat Device pocket well healed; without hematoma or erythema.  There is no tethering  Clear Regular rate and rhythm, no murmurs or gallops Abd-soft with active BS without hepatomegaly No Clubbing cyanosis edema Skin-warm and dry A & Oriented  Grossly normal sensory and motor function  ECG demonstrates P-synchronous/ AV  pacing   Assessment and  Plan  Complete heart block  Hypertension  Pacemaker-Medtronic The patient's device was interrogated.  The information was reviewed. No changes were made in the programming.     Syncope  Renal failure grade 3-4  CR 1.8  6/18  Atrial oversensing with false Afib   BP well controlled  No syncope  Euvolemic

## 2017-01-26 ENCOUNTER — Encounter: Payer: Self-pay | Admitting: Cardiology

## 2017-04-02 ENCOUNTER — Ambulatory Visit (INDEPENDENT_AMBULATORY_CARE_PROVIDER_SITE_OTHER): Payer: Medicare Other | Admitting: *Deleted

## 2017-04-02 DIAGNOSIS — I442 Atrioventricular block, complete: Secondary | ICD-10-CM

## 2017-04-02 NOTE — Progress Notes (Signed)
Remote pacemaker transmission.   

## 2017-04-03 LAB — CUP PACEART REMOTE DEVICE CHECK
Battery Remaining Longevity: 62 mo
Brady Statistic AP VP Percent: 52.62 %
Brady Statistic AP VS Percent: 0 %
Brady Statistic AS VS Percent: 0.01 %
Brady Statistic RV Percent Paced: 99.95 %
Date Time Interrogation Session: 20181112163954
Implantable Lead Implant Date: 20150708
Implantable Lead Model: 5076
Lead Channel Impedance Value: 418 Ohm
Lead Channel Impedance Value: 418 Ohm
Lead Channel Impedance Value: 475 Ohm
Lead Channel Pacing Threshold Amplitude: 0.625 V
Lead Channel Sensing Intrinsic Amplitude: 12 mV
Lead Channel Sensing Intrinsic Amplitude: 12 mV
Lead Channel Setting Pacing Amplitude: 2.5 V
Lead Channel Setting Sensing Sensitivity: 4 mV
MDC IDC LEAD IMPLANT DT: 20150708
MDC IDC LEAD LOCATION: 753859
MDC IDC LEAD LOCATION: 753860
MDC IDC MSMT BATTERY VOLTAGE: 3 V
MDC IDC MSMT LEADCHNL RA IMPEDANCE VALUE: 361 Ohm
MDC IDC MSMT LEADCHNL RA PACING THRESHOLD AMPLITUDE: 0.75 V
MDC IDC MSMT LEADCHNL RA PACING THRESHOLD PULSEWIDTH: 0.4 ms
MDC IDC MSMT LEADCHNL RA SENSING INTR AMPL: 0.875 mV
MDC IDC MSMT LEADCHNL RA SENSING INTR AMPL: 0.875 mV
MDC IDC MSMT LEADCHNL RV PACING THRESHOLD PULSEWIDTH: 0.4 ms
MDC IDC PG IMPLANT DT: 20150708
MDC IDC SET LEADCHNL RA PACING AMPLITUDE: 2 V
MDC IDC SET LEADCHNL RV PACING PULSEWIDTH: 0.4 ms
MDC IDC STAT BRADY AS VP PERCENT: 47.37 %
MDC IDC STAT BRADY RA PERCENT PACED: 52.58 %

## 2017-04-06 ENCOUNTER — Encounter: Payer: Self-pay | Admitting: Cardiology

## 2017-07-02 ENCOUNTER — Telehealth: Payer: Self-pay | Admitting: Cardiology

## 2017-07-02 ENCOUNTER — Encounter: Payer: Medicare Other | Admitting: *Deleted

## 2017-07-02 NOTE — Telephone Encounter (Signed)
LMOVM reminding pt to send remote transmission.   

## 2017-07-04 ENCOUNTER — Encounter: Payer: Self-pay | Admitting: Cardiology

## 2017-07-16 ENCOUNTER — Ambulatory Visit (INDEPENDENT_AMBULATORY_CARE_PROVIDER_SITE_OTHER): Payer: 59 | Admitting: *Deleted

## 2017-07-16 DIAGNOSIS — I442 Atrioventricular block, complete: Secondary | ICD-10-CM | POA: Diagnosis not present

## 2017-07-17 NOTE — Progress Notes (Signed)
Remote pacemaker transmission.   

## 2017-07-19 ENCOUNTER — Encounter: Payer: Self-pay | Admitting: Cardiology

## 2017-07-31 LAB — CUP PACEART REMOTE DEVICE CHECK
Brady Statistic AS VP Percent: 66.23 %
Brady Statistic AS VS Percent: 0.02 %
Brady Statistic RA Percent Paced: 33.72 %
Brady Statistic RV Percent Paced: 99.91 %
Implantable Lead Location: 753860
Implantable Lead Model: 5076
Lead Channel Impedance Value: 361 Ohm
Lead Channel Impedance Value: 418 Ohm
Lead Channel Pacing Threshold Amplitude: 0.625 V
Lead Channel Pacing Threshold Amplitude: 0.625 V
Lead Channel Pacing Threshold Pulse Width: 0.4 ms
Lead Channel Pacing Threshold Pulse Width: 0.4 ms
Lead Channel Sensing Intrinsic Amplitude: 1.125 mV
Lead Channel Sensing Intrinsic Amplitude: 14.625 mV
Lead Channel Setting Pacing Pulse Width: 0.4 ms
MDC IDC LEAD IMPLANT DT: 20150708
MDC IDC LEAD IMPLANT DT: 20150708
MDC IDC LEAD LOCATION: 753859
MDC IDC MSMT BATTERY REMAINING LONGEVITY: 61 mo
MDC IDC MSMT BATTERY VOLTAGE: 2.99 V
MDC IDC MSMT LEADCHNL RA IMPEDANCE VALUE: 399 Ohm
MDC IDC MSMT LEADCHNL RA SENSING INTR AMPL: 1.125 mV
MDC IDC MSMT LEADCHNL RV IMPEDANCE VALUE: 475 Ohm
MDC IDC MSMT LEADCHNL RV SENSING INTR AMPL: 14.625 mV
MDC IDC PG IMPLANT DT: 20150708
MDC IDC SESS DTM: 20190225194722
MDC IDC SET LEADCHNL RA PACING AMPLITUDE: 2 V
MDC IDC SET LEADCHNL RV PACING AMPLITUDE: 2.5 V
MDC IDC SET LEADCHNL RV SENSING SENSITIVITY: 4 mV
MDC IDC STAT BRADY AP VP PERCENT: 33.75 %
MDC IDC STAT BRADY AP VS PERCENT: 0.01 %

## 2017-09-18 ENCOUNTER — Telehealth: Payer: Self-pay | Admitting: Internal Medicine

## 2017-09-18 NOTE — Telephone Encounter (Signed)
No answer. Left message to call back.   

## 2017-09-18 NOTE — Telephone Encounter (Signed)
Pt returning our call   Would like a call back

## 2017-09-18 NOTE — Telephone Encounter (Signed)
Pt c/o Shortness Of Breath: STAT if SOB developed within the last 24 hours or pt is noticeably SOB on the phone  1. Are you currently SOB (can you hear that pt is SOB on the phone)? No   2. How long have you been experiencing SOB?  4/29   3. Are you SOB when sitting or when up moving around? Laying down only relieved when sitting up   4. Are you currently experiencing any other symptoms? Gasping like a fish

## 2017-09-19 NOTE — Telephone Encounter (Signed)
I left a message for the patient to call. 

## 2017-10-17 ENCOUNTER — Ambulatory Visit (INDEPENDENT_AMBULATORY_CARE_PROVIDER_SITE_OTHER): Payer: Medicare HMO | Admitting: *Deleted

## 2017-10-17 DIAGNOSIS — I442 Atrioventricular block, complete: Secondary | ICD-10-CM

## 2017-10-17 NOTE — Progress Notes (Signed)
Remote pacemaker transmission.   

## 2017-10-18 LAB — CUP PACEART REMOTE DEVICE CHECK
Battery Voltage: 2.99 V
Brady Statistic AP VP Percent: 53.21 %
Brady Statistic AS VP Percent: 46.74 %
Brady Statistic RA Percent Paced: 53.21 %
Date Time Interrogation Session: 20190528142208
Implantable Lead Implant Date: 20150708
Implantable Lead Location: 753860
Implantable Lead Model: 5076
Implantable Lead Model: 5076
Implantable Pulse Generator Implant Date: 20150708
Lead Channel Impedance Value: 342 Ohm
Lead Channel Impedance Value: 418 Ohm
Lead Channel Pacing Threshold Amplitude: 0.625 V
Lead Channel Pacing Threshold Pulse Width: 0.4 ms
Lead Channel Pacing Threshold Pulse Width: 0.4 ms
Lead Channel Sensing Intrinsic Amplitude: 17.75 mV
Lead Channel Setting Pacing Amplitude: 2 V
Lead Channel Setting Pacing Pulse Width: 0.4 ms
Lead Channel Setting Sensing Sensitivity: 4 mV
MDC IDC LEAD IMPLANT DT: 20150708
MDC IDC LEAD LOCATION: 753859
MDC IDC MSMT BATTERY REMAINING LONGEVITY: 53 mo
MDC IDC MSMT LEADCHNL RA IMPEDANCE VALUE: 399 Ohm
MDC IDC MSMT LEADCHNL RA SENSING INTR AMPL: 0.75 mV
MDC IDC MSMT LEADCHNL RA SENSING INTR AMPL: 0.75 mV
MDC IDC MSMT LEADCHNL RV IMPEDANCE VALUE: 475 Ohm
MDC IDC MSMT LEADCHNL RV PACING THRESHOLD AMPLITUDE: 0.75 V
MDC IDC MSMT LEADCHNL RV SENSING INTR AMPL: 17.75 mV
MDC IDC SET LEADCHNL RV PACING AMPLITUDE: 2.5 V
MDC IDC STAT BRADY AP VS PERCENT: 0.02 %
MDC IDC STAT BRADY AS VS PERCENT: 0.04 %
MDC IDC STAT BRADY RV PERCENT PACED: 99.94 %

## 2018-01-16 ENCOUNTER — Telehealth: Payer: Self-pay

## 2018-01-16 ENCOUNTER — Ambulatory Visit (INDEPENDENT_AMBULATORY_CARE_PROVIDER_SITE_OTHER): Payer: Medicare HMO | Admitting: *Deleted

## 2018-01-16 DIAGNOSIS — I442 Atrioventricular block, complete: Secondary | ICD-10-CM

## 2018-01-16 DIAGNOSIS — I10 Essential (primary) hypertension: Secondary | ICD-10-CM

## 2018-01-16 NOTE — Telephone Encounter (Signed)
LMOVM reminding pt to send remote transmission.   

## 2018-01-17 NOTE — Progress Notes (Signed)
Remote pacemaker transmission.   

## 2018-02-05 LAB — CUP PACEART REMOTE DEVICE CHECK
Battery Voltage: 2.99 V
Brady Statistic AP VP Percent: 52.96 %
Brady Statistic RA Percent Paced: 52.95 %
Brady Statistic RV Percent Paced: 99.9 %
Implantable Lead Implant Date: 20150708
Implantable Lead Location: 753860
Implantable Pulse Generator Implant Date: 20150708
Lead Channel Impedance Value: 418 Ohm
Lead Channel Impedance Value: 494 Ohm
Lead Channel Pacing Threshold Amplitude: 0.625 V
Lead Channel Pacing Threshold Pulse Width: 0.4 ms
Lead Channel Setting Pacing Amplitude: 2 V
Lead Channel Setting Sensing Sensitivity: 4 mV
MDC IDC LEAD IMPLANT DT: 20150708
MDC IDC LEAD LOCATION: 753859
MDC IDC MSMT BATTERY REMAINING LONGEVITY: 50 mo
MDC IDC MSMT LEADCHNL RA IMPEDANCE VALUE: 361 Ohm
MDC IDC MSMT LEADCHNL RA IMPEDANCE VALUE: 399 Ohm
MDC IDC MSMT LEADCHNL RA SENSING INTR AMPL: 1 mV
MDC IDC MSMT LEADCHNL RA SENSING INTR AMPL: 1 mV
MDC IDC MSMT LEADCHNL RV PACING THRESHOLD AMPLITUDE: 0.625 V
MDC IDC MSMT LEADCHNL RV PACING THRESHOLD PULSEWIDTH: 0.4 ms
MDC IDC MSMT LEADCHNL RV SENSING INTR AMPL: 21.125 mV
MDC IDC MSMT LEADCHNL RV SENSING INTR AMPL: 21.125 mV
MDC IDC SESS DTM: 20190829163942
MDC IDC SET LEADCHNL RV PACING AMPLITUDE: 2.5 V
MDC IDC SET LEADCHNL RV PACING PULSEWIDTH: 0.4 ms
MDC IDC STAT BRADY AP VS PERCENT: 0.02 %
MDC IDC STAT BRADY AS VP PERCENT: 46.98 %
MDC IDC STAT BRADY AS VS PERCENT: 0.03 %

## 2018-04-17 ENCOUNTER — Ambulatory Visit (INDEPENDENT_AMBULATORY_CARE_PROVIDER_SITE_OTHER): Payer: Medicare HMO

## 2018-04-17 DIAGNOSIS — I442 Atrioventricular block, complete: Secondary | ICD-10-CM

## 2018-04-17 NOTE — Progress Notes (Signed)
Remote pacemaker transmission.   

## 2018-04-25 ENCOUNTER — Encounter: Payer: Self-pay | Admitting: Cardiology

## 2018-06-07 LAB — CUP PACEART REMOTE DEVICE CHECK
Battery Voltage: 2.98 V
Brady Statistic RA Percent Paced: 56.99 %
Date Time Interrogation Session: 20191127154609
Implantable Lead Implant Date: 20150708
Implantable Lead Location: 753860
Implantable Lead Model: 5076
Implantable Lead Model: 5076
Implantable Pulse Generator Implant Date: 20150708
Lead Channel Impedance Value: 361 Ohm
Lead Channel Impedance Value: 494 Ohm
Lead Channel Pacing Threshold Amplitude: 0.625 V
Lead Channel Pacing Threshold Pulse Width: 0.4 ms
Lead Channel Pacing Threshold Pulse Width: 0.4 ms
Lead Channel Sensing Intrinsic Amplitude: 1.125 mV
Lead Channel Sensing Intrinsic Amplitude: 25.5 mV
Lead Channel Setting Pacing Amplitude: 2 V
Lead Channel Setting Pacing Pulse Width: 0.4 ms
MDC IDC LEAD IMPLANT DT: 20150708
MDC IDC LEAD LOCATION: 753859
MDC IDC MSMT BATTERY REMAINING LONGEVITY: 46 mo
MDC IDC MSMT LEADCHNL RA IMPEDANCE VALUE: 418 Ohm
MDC IDC MSMT LEADCHNL RA SENSING INTR AMPL: 1.125 mV
MDC IDC MSMT LEADCHNL RV IMPEDANCE VALUE: 418 Ohm
MDC IDC MSMT LEADCHNL RV PACING THRESHOLD AMPLITUDE: 0.625 V
MDC IDC MSMT LEADCHNL RV SENSING INTR AMPL: 25.5 mV
MDC IDC SET LEADCHNL RV PACING AMPLITUDE: 2.5 V
MDC IDC SET LEADCHNL RV SENSING SENSITIVITY: 4 mV
MDC IDC STAT BRADY AP VP PERCENT: 57 %
MDC IDC STAT BRADY AP VS PERCENT: 0.01 %
MDC IDC STAT BRADY AS VP PERCENT: 42.98 %
MDC IDC STAT BRADY AS VS PERCENT: 0.02 %
MDC IDC STAT BRADY RV PERCENT PACED: 99.97 %

## 2018-07-17 ENCOUNTER — Ambulatory Visit: Payer: Medicare HMO | Admitting: *Deleted

## 2018-07-18 ENCOUNTER — Telehealth: Payer: Self-pay

## 2018-07-18 NOTE — Telephone Encounter (Signed)
Left message for patient to remind of missed remote transmission.  

## 2018-07-22 ENCOUNTER — Ambulatory Visit (INDEPENDENT_AMBULATORY_CARE_PROVIDER_SITE_OTHER): Payer: Medicare HMO | Admitting: *Deleted

## 2018-07-22 ENCOUNTER — Telehealth: Payer: Self-pay | Admitting: Internal Medicine

## 2018-07-22 DIAGNOSIS — I442 Atrioventricular block, complete: Secondary | ICD-10-CM

## 2018-07-22 LAB — CUP PACEART REMOTE DEVICE CHECK
Battery Remaining Longevity: 47 mo
Battery Voltage: 2.98 V
Brady Statistic AS VS Percent: 0.03 %
Brady Statistic RA Percent Paced: 42.48 %
Brady Statistic RV Percent Paced: 99.84 %
Date Time Interrogation Session: 20200302150406
Implantable Lead Implant Date: 20150708
Implantable Lead Location: 753860
Implantable Lead Model: 5076
Implantable Lead Model: 5076
Implantable Pulse Generator Implant Date: 20150708
Lead Channel Impedance Value: 494 Ohm
Lead Channel Pacing Threshold Pulse Width: 0.4 ms
Lead Channel Sensing Intrinsic Amplitude: 0.625 mV
Lead Channel Sensing Intrinsic Amplitude: 0.625 mV
Lead Channel Sensing Intrinsic Amplitude: 13.75 mV
Lead Channel Setting Pacing Amplitude: 2.5 V
Lead Channel Setting Sensing Sensitivity: 4 mV
MDC IDC LEAD IMPLANT DT: 20150708
MDC IDC LEAD LOCATION: 753859
MDC IDC MSMT LEADCHNL RA IMPEDANCE VALUE: 323 Ohm
MDC IDC MSMT LEADCHNL RA IMPEDANCE VALUE: 380 Ohm
MDC IDC MSMT LEADCHNL RA PACING THRESHOLD AMPLITUDE: 0.875 V
MDC IDC MSMT LEADCHNL RV IMPEDANCE VALUE: 399 Ohm
MDC IDC MSMT LEADCHNL RV PACING THRESHOLD AMPLITUDE: 0.75 V
MDC IDC MSMT LEADCHNL RV PACING THRESHOLD PULSEWIDTH: 0.4 ms
MDC IDC MSMT LEADCHNL RV SENSING INTR AMPL: 13.75 mV
MDC IDC SET LEADCHNL RA PACING AMPLITUDE: 2 V
MDC IDC SET LEADCHNL RV PACING PULSEWIDTH: 0.4 ms
MDC IDC STAT BRADY AP VP PERCENT: 42.51 %
MDC IDC STAT BRADY AP VS PERCENT: 0.03 %
MDC IDC STAT BRADY AS VP PERCENT: 57.43 %

## 2018-07-22 NOTE — Telephone Encounter (Signed)
Pt c/o Shortness Of Breath: STAT if SOB developed within the last 24 hours or pt is noticeably SOB on the phone  1. Are you currently SOB (can you hear that pt is SOB on the phone)? Yes   2. How long have you been experiencing SOB?  1 week   3. Are you SOB when sitting or when up moving around? Exertion   4. Are you currently experiencing any other symptoms?  No    Scheduled 3/24 at 10 am

## 2018-07-22 NOTE — Telephone Encounter (Signed)
Attempted to call the patient- no answer. I left a message to please call back.

## 2018-07-22 NOTE — Telephone Encounter (Signed)
Patient completed transmission today .

## 2018-07-25 NOTE — Telephone Encounter (Signed)
LMOM

## 2018-07-29 NOTE — Progress Notes (Signed)
Remote pacemaker transmission.   

## 2018-07-30 NOTE — Telephone Encounter (Signed)
Call to patient, [facility].  Multiple attempts made with voicemails left. No call back.   Encounter closed. Encouraged pt to call back with any questions or concerns.

## 2018-08-06 ENCOUNTER — Telehealth: Payer: Self-pay | Admitting: Internal Medicine

## 2018-08-06 NOTE — Telephone Encounter (Signed)
Called and spoke to pt regarding appointment 08/13/18 for PM followup      H Unable to reach pt. Left VM  Can u call please and triage upcoming appt  Thanks S

## 2018-08-09 NOTE — Telephone Encounter (Signed)
The patient is scheduled to see Dr. Caryl Comes on 08/13/18. I left a message for the patient to please call back need to screen prior to his appointment.

## 2018-08-12 NOTE — Telephone Encounter (Signed)
Attempted to call the patient regarding his 08/13/18 appointment with Dr. Caryl Comes for tomorrow.  I left a message for the patient to call back for assessment/ screening.

## 2018-08-13 ENCOUNTER — Encounter: Payer: Medicare HMO | Admitting: Internal Medicine

## 2018-08-13 NOTE — Telephone Encounter (Signed)
The patient did show up 1 hour late for his apt with Dr. Caryl Comes. The patient was advised that the MD was off the floor at this time. Inquired if he is experiencing any new or worsening symptoms at this time. The patient confirmed he is stable at this time.  We will r/s his appointment out ~ 6 weeks due to COVID-19  Appt scheduled for 10/01/18 with Dr. Caryl Comes.

## 2018-09-16 ENCOUNTER — Telehealth: Payer: Self-pay

## 2018-09-16 NOTE — Telephone Encounter (Signed)
[facility] E-Visit change appointment with Dr. Caryl Comes 10/01/18 to a virtual visit same time 11:30

## 2018-09-27 ENCOUNTER — Telehealth: Payer: Self-pay

## 2018-09-27 NOTE — Telephone Encounter (Signed)
LMOVM reference change visit to an E-Visit and the time to 8:30.

## 2018-10-01 ENCOUNTER — Other Ambulatory Visit: Payer: Self-pay

## 2018-10-01 ENCOUNTER — Encounter: Payer: Medicare HMO | Admitting: Internal Medicine

## 2018-10-01 NOTE — Progress Notes (Signed)
Error-no show       This encounter was created in error - please disregard.

## 2018-10-21 ENCOUNTER — Encounter: Payer: Medicare HMO | Admitting: *Deleted

## 2018-10-22 ENCOUNTER — Telehealth: Payer: Self-pay

## 2018-10-22 NOTE — Telephone Encounter (Signed)
Left message for patient to remind of missed remote transmission.  

## 2018-10-29 ENCOUNTER — Encounter: Payer: Self-pay | Admitting: Cardiology

## 2018-10-30 ENCOUNTER — Telehealth: Payer: Self-pay

## 2018-10-30 NOTE — Telephone Encounter (Signed)
Attempted to contact patient, he is past due for a follow up visit and was a no show for Dr. Caryl Comes.  LMOVM to call and schedule an appointment.

## 2018-11-04 ENCOUNTER — Emergency Department: Payer: Medicare HMO

## 2018-11-04 ENCOUNTER — Encounter: Payer: Self-pay | Admitting: Emergency Medicine

## 2018-11-04 ENCOUNTER — Inpatient Hospital Stay
Admission: EM | Admit: 2018-11-04 | Discharge: 2018-11-15 | DRG: 291 | Disposition: A | Discharge status: Home Health | Payer: Medicare HMO | Attending: Internal Medicine | Admitting: Internal Medicine

## 2018-11-04 ENCOUNTER — Other Ambulatory Visit: Payer: Self-pay

## 2018-11-04 DIAGNOSIS — N049 Nephrotic syndrome with unspecified morphologic changes: Secondary | ICD-10-CM | POA: Diagnosis present

## 2018-11-04 DIAGNOSIS — Z87891 Personal history of nicotine dependence: Secondary | ICD-10-CM

## 2018-11-04 DIAGNOSIS — E1121 Type 2 diabetes mellitus with diabetic nephropathy: Secondary | ICD-10-CM | POA: Diagnosis present

## 2018-11-04 DIAGNOSIS — I130 Hypertensive heart and chronic kidney disease with heart failure and stage 1 through stage 4 chronic kidney disease, or unspecified chronic kidney disease: Principal | ICD-10-CM | POA: Diagnosis present

## 2018-11-04 DIAGNOSIS — I420 Dilated cardiomyopathy: Secondary | ICD-10-CM | POA: Diagnosis present

## 2018-11-04 DIAGNOSIS — I82819 Embolism and thrombosis of superficial veins of unspecified lower extremities: Secondary | ICD-10-CM | POA: Diagnosis present

## 2018-11-04 DIAGNOSIS — I248 Other forms of acute ischemic heart disease: Secondary | ICD-10-CM | POA: Diagnosis present

## 2018-11-04 DIAGNOSIS — E785 Hyperlipidemia, unspecified: Secondary | ICD-10-CM | POA: Diagnosis present

## 2018-11-04 DIAGNOSIS — Z833 Family history of diabetes mellitus: Secondary | ICD-10-CM | POA: Diagnosis not present

## 2018-11-04 DIAGNOSIS — E8809 Other disorders of plasma-protein metabolism, not elsewhere classified: Secondary | ICD-10-CM | POA: Diagnosis present

## 2018-11-04 DIAGNOSIS — Z794 Long term (current) use of insulin: Secondary | ICD-10-CM

## 2018-11-04 DIAGNOSIS — Z79899 Other long term (current) drug therapy: Secondary | ICD-10-CM | POA: Diagnosis not present

## 2018-11-04 DIAGNOSIS — R0602 Shortness of breath: Secondary | ICD-10-CM

## 2018-11-04 DIAGNOSIS — G4733 Obstructive sleep apnea (adult) (pediatric): Secondary | ICD-10-CM | POA: Diagnosis present

## 2018-11-04 DIAGNOSIS — Z950 Presence of cardiac pacemaker: Secondary | ICD-10-CM

## 2018-11-04 DIAGNOSIS — I5023 Acute on chronic systolic (congestive) heart failure: Secondary | ICD-10-CM | POA: Diagnosis present

## 2018-11-04 DIAGNOSIS — E1122 Type 2 diabetes mellitus with diabetic chronic kidney disease: Secondary | ICD-10-CM | POA: Diagnosis present

## 2018-11-04 DIAGNOSIS — I509 Heart failure, unspecified: Secondary | ICD-10-CM

## 2018-11-04 DIAGNOSIS — I340 Nonrheumatic mitral (valve) insufficiency: Secondary | ICD-10-CM | POA: Diagnosis not present

## 2018-11-04 DIAGNOSIS — I361 Nonrheumatic tricuspid (valve) insufficiency: Secondary | ICD-10-CM | POA: Diagnosis not present

## 2018-11-04 DIAGNOSIS — Z1159 Encounter for screening for other viral diseases: Secondary | ICD-10-CM | POA: Diagnosis not present

## 2018-11-04 DIAGNOSIS — N184 Chronic kidney disease, stage 4 (severe): Secondary | ICD-10-CM | POA: Diagnosis present

## 2018-11-04 DIAGNOSIS — N179 Acute kidney failure, unspecified: Secondary | ICD-10-CM | POA: Diagnosis present

## 2018-11-04 DIAGNOSIS — R778 Other specified abnormalities of plasma proteins: Secondary | ICD-10-CM

## 2018-11-04 LAB — GLUCOSE, CAPILLARY
Glucose-Capillary: 383 mg/dL — ABNORMAL HIGH (ref 70–99)
Glucose-Capillary: 363 mg/dL — ABNORMAL HIGH (ref 70–99)
Glucose-Capillary: 270 mg/dL — ABNORMAL HIGH (ref 70–99)

## 2018-11-04 LAB — CBC
HCT: 40.8 % (ref 39.0–52.0)
Hemoglobin: 13.6 g/dL (ref 13.0–17.0)
MCH: 29.8 pg (ref 26.0–34.0)
MCHC: 33.3 g/dL (ref 30.0–36.0)
MCV: 89.3 fL (ref 80.0–100.0)
Platelets: 212 10*3/uL (ref 150–400)
RBC: 4.57 MIL/uL (ref 4.22–5.81)
RDW: 14.2 % (ref 11.5–15.5)
WBC: 7.7 10*3/uL (ref 4.0–10.5)
nRBC: 0 % (ref 0.0–0.2)

## 2018-11-04 LAB — BASIC METABOLIC PANEL
Anion gap: 13 (ref 5–15)
BUN: 37 mg/dL — ABNORMAL HIGH (ref 8–23)
CO2: 22 mmol/L (ref 22–32)
Calcium: 8.9 mg/dL (ref 8.9–10.3)
Chloride: 99 mmol/L (ref 98–111)
Creatinine, Ser: 2.13 mg/dL — ABNORMAL HIGH (ref 0.61–1.24)
GFR calc Af Amer: 34 mL/min — ABNORMAL LOW (ref >60)
GFR calc non Af Amer: 29 mL/min — ABNORMAL LOW (ref >60)
Glucose, Bld: 437 mg/dL — ABNORMAL HIGH (ref 70–99)
Potassium: 4.1 mmol/L (ref 3.5–5.1)
Sodium: 134 mmol/L — ABNORMAL LOW (ref 135–145)

## 2018-11-04 LAB — TROPONIN I: Troponin I: 0.05 ng/mL (ref <0.03)

## 2018-11-04 LAB — HEMOGLOBIN A1C
Hgb A1c MFr Bld: 12.1 % — ABNORMAL HIGH (ref 4.8–5.6)
Mean Plasma Glucose: 300.57 mg/dL

## 2018-11-04 LAB — BRAIN NATRIURETIC PEPTIDE: B Natriuretic Peptide: >4500 pg/mL — ABNORMAL HIGH (ref 0.0–100.0)

## 2018-11-04 LAB — SARS CORONAVIRUS 2 BY RT PCR (HOSPITAL ORDER, PERFORMED IN ~~LOC~~ HOSPITAL LAB): SARS Coronavirus 2: NEGATIVE

## 2018-11-04 MED ORDER — LABETALOL HCL 200 MG PO TABS
400.0000 mg | ORAL_TABLET | Freq: Two times a day (BID) | ORAL | Status: DC
  Administered 2018-11-04 – 2018-11-09 (×11): 400 mg via ORAL
  Filled 2018-11-04 (×13): qty 2

## 2018-11-04 MED ORDER — INSULIN ASPART 100 UNIT/ML ~~LOC~~ SOLN
10.0000 [IU] | Freq: Once | SUBCUTANEOUS | Status: AC
  Administered 2018-11-04: 10 [IU] via SUBCUTANEOUS
  Filled 2018-11-04: qty 1

## 2018-11-04 MED ORDER — ASPIRIN EC 81 MG PO TBEC
81.0000 mg | DELAYED_RELEASE_TABLET | ORAL | Status: DC
  Administered 2018-11-06 – 2018-11-10 (×3): 81 mg via ORAL
  Filled 2018-11-04 (×3): qty 1

## 2018-11-04 MED ORDER — ACETAMINOPHEN 325 MG PO TABS
650.0000 mg | ORAL_TABLET | Freq: Four times a day (QID) | ORAL | Status: DC | PRN
  Administered 2018-11-11 – 2018-11-15 (×3): 650 mg via ORAL
  Filled 2018-11-04 (×3): qty 2

## 2018-11-04 MED ORDER — ALBUTEROL SULFATE (2.5 MG/3ML) 0.083% IN NEBU: 2.5000 mg | INHALATION_SOLUTION | RESPIRATORY_TRACT | Status: DC | PRN

## 2018-11-04 MED ORDER — INSULIN ASPART 100 UNIT/ML ~~LOC~~ SOLN
0.0000 [IU] | Freq: Every day | SUBCUTANEOUS | Status: DC
  Administered 2018-11-04: 3 [IU] via SUBCUTANEOUS
  Administered 2018-11-06: 2 [IU] via SUBCUTANEOUS
  Administered 2018-11-07: 3 [IU] via SUBCUTANEOUS
  Administered 2018-11-09: 2 [IU] via SUBCUTANEOUS
  Administered 2018-11-10: 4 [IU] via SUBCUTANEOUS
  Administered 2018-11-11 – 2018-11-13 (×2): 2 [IU] via SUBCUTANEOUS
  Filled 2018-11-04 (×7): qty 1

## 2018-11-04 MED ORDER — ONDANSETRON HCL 4 MG PO TABS: 4.0000 mg | ORAL_TABLET | Freq: Four times a day (QID) | ORAL | Status: DC | PRN

## 2018-11-04 MED ORDER — AMLODIPINE BESYLATE 10 MG PO TABS
10.0000 mg | ORAL_TABLET | Freq: Every day | ORAL | Status: DC
  Administered 2018-11-05 – 2018-11-09 (×5): 10 mg via ORAL
  Filled 2018-11-04 (×5): qty 1

## 2018-11-04 MED ORDER — HEPARIN SODIUM (PORCINE) 5000 UNIT/ML IJ SOLN: 5000.0000 [IU] | Freq: Three times a day (TID) | INTRAMUSCULAR | Status: DC

## 2018-11-04 MED ORDER — ONDANSETRON HCL 4 MG/2ML IJ SOLN: 4.0000 mg | Freq: Four times a day (QID) | INTRAMUSCULAR | Status: DC | PRN

## 2018-11-04 MED ORDER — APIXABAN 5 MG PO TABS
10.0000 mg | ORAL_TABLET | Freq: Two times a day (BID) | ORAL | Status: DC
  Administered 2018-11-04 – 2018-11-09 (×10): 10 mg via ORAL
  Filled 2018-11-04 (×11): qty 2

## 2018-11-04 MED ORDER — INSULIN GLARGINE 100 UNIT/ML ~~LOC~~ SOLN
18.0000 [IU] | Freq: Every day | SUBCUTANEOUS | Status: DC
  Administered 2018-11-04: 18 [IU] via SUBCUTANEOUS
  Filled 2018-11-04 (×2): qty 0.18

## 2018-11-04 MED ORDER — POLYETHYLENE GLYCOL 3350 17 G PO PACK: 17.0000 g | PACK | Freq: Every day | ORAL | Status: DC | PRN

## 2018-11-04 MED ORDER — FUROSEMIDE 10 MG/ML IJ SOLN
60.0000 mg | Freq: Two times a day (BID) | INTRAMUSCULAR | Status: DC
  Administered 2018-11-04 – 2018-11-06 (×4): 60 mg via INTRAVENOUS
  Filled 2018-11-04 (×4): qty 6

## 2018-11-04 MED ORDER — APIXABAN 5 MG PO TABS: 5.0000 mg | ORAL_TABLET | Freq: Two times a day (BID) | ORAL | Status: DC

## 2018-11-04 MED ORDER — HYDRALAZINE HCL 20 MG/ML IJ SOLN
10.0000 mg | Freq: Four times a day (QID) | INTRAMUSCULAR | Status: DC | PRN
  Administered 2018-11-04: 10 mg via INTRAVENOUS
  Filled 2018-11-04: qty 1

## 2018-11-04 MED ORDER — SODIUM CHLORIDE 0.9% FLUSH: 3.0000 mL | Freq: Once | INTRAVENOUS | Status: DC

## 2018-11-04 MED ORDER — INSULIN ASPART 100 UNIT/ML ~~LOC~~ SOLN
0.0000 [IU] | Freq: Three times a day (TID) | SUBCUTANEOUS | Status: DC
  Administered 2018-11-04: 15 [IU] via SUBCUTANEOUS
  Administered 2018-11-06: 1 [IU] via SUBCUTANEOUS
  Administered 2018-11-06 – 2018-11-07 (×3): 3 [IU] via SUBCUTANEOUS
  Administered 2018-11-08: 11 [IU] via SUBCUTANEOUS
  Administered 2018-11-08: 5 [IU] via SUBCUTANEOUS
  Administered 2018-11-08: 8 [IU] via SUBCUTANEOUS
  Administered 2018-11-09: 5 [IU] via SUBCUTANEOUS
  Administered 2018-11-09 – 2018-11-10 (×2): 3 [IU] via SUBCUTANEOUS
  Administered 2018-11-10 – 2018-11-11 (×2): 8 [IU] via SUBCUTANEOUS
  Administered 2018-11-12: 2 [IU] via SUBCUTANEOUS
  Administered 2018-11-12 – 2018-11-13 (×3): 5 [IU] via SUBCUTANEOUS
  Administered 2018-11-13: 8 [IU] via SUBCUTANEOUS
  Administered 2018-11-14 (×3): 5 [IU] via SUBCUTANEOUS
  Administered 2018-11-15: 3 [IU] via SUBCUTANEOUS
  Administered 2018-11-15: 5 [IU] via SUBCUTANEOUS
  Filled 2018-11-04 (×20): qty 1

## 2018-11-04 MED ORDER — ACETAMINOPHEN 650 MG RE SUPP: 650.0000 mg | Freq: Four times a day (QID) | RECTAL | Status: DC | PRN

## 2018-11-04 MED ORDER — FUROSEMIDE 10 MG/ML IJ SOLN
20.0000 mg | Freq: Once | INTRAMUSCULAR | Status: AC
  Administered 2018-11-04: 20 mg via INTRAVENOUS
  Filled 2018-11-04: qty 4

## 2018-11-04 MED ORDER — SODIUM CHLORIDE 0.9% FLUSH
3.0000 mL | Freq: Two times a day (BID) | INTRAVENOUS | Status: DC
  Administered 2018-11-04 – 2018-11-15 (×15): 3 mL via INTRAVENOUS

## 2018-11-04 NOTE — H&P (Signed)
[place] [facility] [place] NAME: Kevin Gibson    MR#:  [id number]  DATE OF BIRTH:  May 07, 1941  DATE OF ADMISSION:  11/04/2018  PRIMARY CARE PHYSICIAN: Kirk Ruths, MD   REQUESTING/REFERRING PHYSICIAN: Dr. Rip Harbour  CHIEF COMPLAINT:   Chief Complaint  Patient presents with  . Shortness of Breath  . Leg Swelling    HISTORY OF PRESENT ILLNESS:  Kevin Gibson  is a 78 y.o. 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  He has noticed significant swelling and bilateral lower extremities and right upper extremity in the last 5 days.  No history of DVT.  Patient here has been found to have pulmonary edema on chest x-ray, significantly elevated BNP of greater than 4500.  Oxygen saturations at times dropped into the high 80s.  Patient is being admitted for CHF needing IV diuresis as inpatient.  Blood sugars found to be 427.  Patient stopped taking his insulin a few months back.  COVID-19 test negative  PAST MEDICAL HISTORY:   Past Medical History:  Diagnosis Date  . Chronic kidney disease   . Diabetes mellitus without complication ([place])   . Heart murmur   . Hyperlipidemia   . Hypertension   . Pacemaker   . RBBB   . Syncope and collapse   . Third degree heart block ([place])     PAST SURGICAL HISTORY:   Past Surgical History:  Procedure Laterality Date  . PACEMAKER INSERTION    . PERMANENT PACEMAKER INSERTION N/A 11/26/2013   Procedure: PERMANENT PACEMAKER INSERTION;  Surgeon: Deboraha Sprang, MD;  Location: [facility] CATH LAB;  Service: Cardiovascular;  Laterality: N/A;    SOCIAL HISTORY:   Social History   Tobacco Use  . Smoking status: Former Smoker    Years: 0.00    Types: Cigarettes    Quit date: 04/21/2000    Years since quitting: 18.5   . Smokeless tobacco: Never Used  Substance Use Topics  . Alcohol use: No    FAMILY HISTORY:   Family History  Problem Relation Age of Onset  . Diabetes Mother     DRUG ALLERGIES:  No Known Allergies  REVIEW OF SYSTEMS:   Review of Systems  Constitutional: Positive for malaise/fatigue. Negative for chills, fever and weight loss.  HENT: Negative for hearing loss and nosebleeds.   Eyes: Negative for blurred vision, double vision and pain.  Respiratory: Positive for shortness of breath. Negative for cough, hemoptysis, sputum production and wheezing.   Cardiovascular: Positive for orthopnea and leg swelling. Negative for chest pain and palpitations.  Gastrointestinal: Negative for abdominal pain, constipation, diarrhea, nausea and vomiting.  Genitourinary: Negative for dysuria and hematuria.  Musculoskeletal: Negative for back pain, falls and myalgias.  Skin: Negative for rash.  Neurological: Negative for dizziness, tremors, sensory change, speech change, focal weakness, seizures and headaches.  Endo/Heme/Allergies: Does not bruise/bleed easily.  Psychiatric/Behavioral: Negative for depression and memory loss. The patient is not nervous/anxious.     MEDICATIONS AT HOME:   Prior to Admission medications   Medication Sig Start Date End Date Taking? Authorizing Provider  aspirin EC 81 MG tablet Take 81 mg by mouth every other day.   Yes [provider]  labetalol (NORMODYNE) 200 MG tablet Take 2 tablets (400 mg total) by mouth 2 (two)  times daily. Patient taking differently: Take 200 mg by mouth 4 (four) times daily.  03/03/14  Yes Deboraha Sprang, MD  amLODipine (NORVASC) 10 MG tablet TAKE ONE TABLET BY MOUTH ONCE DAILY Patient not taking: Reported on 11/04/2018 02/15/16   Deboraha Sprang, MD     VITAL SIGNS:  Blood pressure (!) 175/127, pulse 94, temperature (!) 96.1 F (35.6 C), temperature source Oral, resp. rate 20, height 5\' 7"  (1.702 m), weight 70.3 kg, SpO2 93  %.  PHYSICAL EXAMINATION:  Physical Exam  GENERAL:  78 y.o.-year-old patient lying in the bed with no acute distress.  EYES: Pupils equal, round, reactive to light and accommodation. No scleral icterus. Extraocular muscles intact.  HEENT: Head atraumatic, normocephalic. Oropharynx and nasopharynx clear. No oropharyngeal erythema, moist oral mucosa  NECK:  Supple, no jugular venous distention. No thyroid enlargement, no tenderness.  LUNGS: Creased breath sounds at the bases CARDIOVASCULAR: S1, S2 normal. No murmurs, rubs, or gallops.  ABDOMEN: Soft, nontender, nondistended. Bowel sounds present. No organomegaly or mass.  EXTREMITIES: Lateral lower extremity edema extending up to his waist.  Right upper extremity edema. NEUROLOGIC: Cranial nerves II through XII are intact. No focal Motor or sensory deficits appreciated b/l PSYCHIATRIC: The patient is alert and oriented x 3. Good affect.  SKIN: No obvious rash, lesion, or ulcer.   LABORATORY PANEL:   CBC Recent Labs  Lab 11/04/18 1148  WBC 7.7  HGB 13.6  HCT 40.8  PLT 212   ------------------------------------------------------------------------------------------------------------------  Chemistries  Recent Labs  Lab 11/04/18 1148  NA 134*  K 4.1  CL 99  CO2 22  GLUCOSE 437*  BUN 37*  CREATININE 2.13*  CALCIUM 8.9   ------------------------------------------------------------------------------------------------------------------  Cardiac Enzymes Recent Labs  Lab 11/04/18 1148  TROPONINI 0.05*   ------------------------------------------------------------------------------------------------------------------  RADIOLOGY:  US Venous Img Upper Uni Left  Result Date: 11/04/2018 CLINICAL DATA:  Left upper extremity swelling for the past 3 days. Evaluate for DVT. EXAM: LEFT UPPER EXTREMITY VENOUS DOPPLER ULTRASOUND TECHNIQUE: Gray-scale sonography with graded compression, as well as color Doppler and duplex ultrasound  were performed to evaluate the upper extremity deep venous system from the level of the subclavian vein and including the jugular, axillary, basilic, radial, ulnar and upper cephalic vein. Spectral Doppler was utilized to evaluate flow at rest and with distal augmentation maneuvers. COMPARISON:  None. FINDINGS: Contralateral Subclavian Vein: Respiratory phasicity is normal and symmetric with the symptomatic side. No evidence of thrombus. Normal compressibility. Internal Jugular Vein: No evidence of thrombus. Normal compressibility, respiratory phasicity and response to augmentation. Subclavian Vein: No evidence of thrombus. Normal compressibility, respiratory phasicity and response to augmentation. Axillary Vein: No evidence of thrombus. Normal compressibility, respiratory phasicity and response to augmentation. Cephalic Vein: No evidence of thrombus. Normal compressibility, respiratory phasicity and response to augmentation. Basilic Vein: There is mixed echogenic occlusive thrombus seen throughout the interrogated course of the slightly atrophic left basilic vein (images 22 through 26). Brachial Veins: No evidence of thrombus. Normal compressibility, respiratory phasicity and response to augmentation. Radial Veins: No evidence of thrombus. Normal compressibility, respiratory phasicity and response to augmentation. Ulnar Veins: No evidence of thrombus. Normal compressibility, respiratory phasicity and response to augmentation. Venous Reflux:  None visualized. Other Findings: Moderate amount of subcutaneous edema is noted throughout the left upper extremity. IMPRESSION: 1. No evidence of DVT within the left lower extremity. 2. 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 Again, there is no extension  of this age-indeterminate SVT to the deep venous system of the left upper extremity. Electronically Signed   By: Sandi Mariscal  M.D.   On: 11/04/2018 14:54   Dg Chest Port 1 View  Result Date: 11/04/2018 CLINICAL DATA:  Shortness of breath, chest pain EXAM: PORTABLE CHEST 1 VIEW COMPARISON:  11/26/2013 FINDINGS: Small bilateral pleural effusions. Mild bilateral interstitial thickening. No pneumothorax. Stable cardiomediastinal silhouette. Dual lead cardiac pacemaker. No aggressive osseous lesion. IMPRESSION: Mild pulmonary edema. Electronically Signed   By: Kathreen Devoid   On: 11/04/2018 12:40     IMPRESSION AND PLAN:   *Acute on chronic diastolic congestive heart failure.  Last echocardiogram was in 2015. Start IV Lasix 60 mg twice daily.  Monitor input and output.  Repeat BUN and creatinine in the morning. Patient is on labetalol at home that have continued.  *CKD stage III.  Baseline creatinine at 2.  Close to his baseline at 2.13 today.  Monitor while being diuresed.  Repeat labs in the morning  *Diabetes mellitus.  Insulin-dependent.  Patient has stopped taking insulin as he was unable to afford for the past few months.  Blood sugars at 427.  Will give stat dose of NovoLog.  Also ordered stat Lantus 18 units to start today, daily.  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  But symptoms started 5 days back.  This can be monitored as outpatient.  But due to his symptoms and significant swelling I have started him on Eliquis.  All the records are reviewed and case discussed with ED provider. Management plans discussed with the patient, family and they are in agreement.  CODE STATUS: FULL CODE  TOTAL TIME TAKING CARE OF THIS PATIENT: 40 minutes.   Neita Carp M.D on 11/04/2018 at 4:39 PM  Between 7am to 6pm - Pager - [phone]  After 6pm go to [email or web address] - password [email or web address] [place] [facility]  Office  [phone]  CC: Primary care physician; Kirk Ruths, MD  Note: This dictation was prepared with Dragon dictation along with  smaller phrase technology. Any transcriptional errors that result from this process are unintentional.

## 2018-11-04 NOTE — ED Provider Notes (Signed)
[facility] Emergency Department Provider Note   ____________________________________________   First MD Initiated Contact with Patient 11/04/18 1239     (approximate)  I have reviewed the triage vital signs and the nursing notes.   HISTORY  Chief Complaint Shortness of Breath and Leg Swelling    HPI [name] is a 78 y.o. male who reports increasing shortness of breath gradually since March.  His left arm is swollen for the last 3 or 4 days.  The biggest [place] he has on him now.  He has bilateral leg edema and edema up to his mid back.  He reports he cannot walk from the house to the car without getting very short of breath.  He has not had any other problems.  He does have a pacemaker in place which is working.         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 Authorizing Provider  amLODipine (NORVASC) 10 MG tablet TAKE ONE TABLET BY MOUTH ONCE DAILY 02/15/16   Deboraha Sprang, MD  aspirin EC 81 MG tablet Take 81 mg by mouth every other day.    [provider]  labetalol (NORMODYNE) 200 MG tablet Take  2 tablets (400 mg total) by mouth 2 (two) times daily. 03/03/14   Deboraha Sprang, MD    Allergies Patient has no known allergies.  Family History  Problem Relation Age of Onset   Diabetes Mother     Social History Social History   Tobacco Use   Smoking status: Former Smoker    Years: 0.00    Types: Cigarettes    Quit date: 04/21/2000    Years since quitting: 18.5   Smokeless tobacco: Never Used  Substance Use Topics   Alcohol use: No   Drug use: No    Review of Systems  Constitutional: No fever/chills Eyes: No visual changes. ENT: No sore throat. Cardiovascular: Denies chest pain. Respiratory:  shortness of breath. Gastrointestinal: No abdominal pain.  No nausea, no vomiting.  No diarrhea.  No constipation. Genitourinary: Negative for dysuria. Musculoskeletal: Negative for back pain. Skin: Negative for rash. Neurological: Negative for headaches, focal weakness   ____________________________________________   PHYSICAL EXAM:  VITAL SIGNS: ED Triage Vitals  Enc Vitals Group     BP 11/04/18 1139 (!) 175/127     Pulse Rate 11/04/18 1139 94     Resp 11/04/18 1139 20     Temp 11/04/18 1139 (!) 96.1 F (35.6 C)     Temp Source 11/04/18 1139 Oral     SpO2 11/04/18 1139 93 %  Weight 11/04/18 1133 154 lb 15.7 oz (70.3 kg)     Height 11/04/18 1133 5\' 7"  (1.702 m)     Head Circumference --      Peak Flow --      Pain Score 11/04/18 1133 0     Pain Loc --      Pain Edu? --      Excl. in [place]? --    Constitutional: Alert and oriented.  Chronically ill-appearing gentleman. Eyes: Conjunctivae are normal.  Head: Atraumatic. Nose: No congestion/rhinnorhea. Mouth/Throat: Mucous membranes are moist.  Oropharynx non-erythematous. Neck: No stridor.   Cardiovascular: Normal rate, regular rhythm. Grossly normal heart sounds.  Good peripheral circulation. Respiratory: Normal respiratory effort.  No retractions. Lungs scattered crackles. Gastrointestinal: Soft and  nontender. No distention. No abdominal bruits. No CVA tenderness.  Patient has edema of the back and abdominal wall Musculoskeletal: No lower extremity tenderness marked edema also edema of the right arm Neurologic:  Normal speech and language. No gross focal neurologic deficits are appreciated.  Skin:  Skin is warm, dry and intact. No rash noted.   ____________________________________________   LABS (all labs ordered are listed, but only abnormal results are displayed)  Labs Reviewed  BASIC METABOLIC PANEL - Abnormal; Notable for the following components:      Result Value   Sodium 134 (*)    Glucose, Bld 437 (*)    BUN 37 (*)    Creatinine, Ser 2.13 (*)    GFR calc non Af Amer 29 (*)    GFR calc Af Amer 34 (*)    All other components within normal limits  BRAIN NATRIURETIC PEPTIDE - Abnormal; Notable for the following components:   B Natriuretic Peptide >4,500.0 (*)    All other components within normal limits  TROPONIN I - Abnormal; Notable for the following components:   Troponin I 0.05 (*)    All other components within normal limits  SARS CORONAVIRUS 2 (HOSPITAL ORDER, [facility])  CBC   ____________________________________________  EKG  EKG read interpreted by me shows fully paced rhythm rate of 92 left axis no acute ST-T changes are visible ____________________________________________  RADIOLOGY  ED MD interpretation: Chest x-ray read by radiology reviewed by me shows some pleural effusions and increased vascular markings consistent with CHF/pulmonary edema  Official radiology report(s): Dg Chest Port 1 View  Result Date: 11/04/2018 CLINICAL DATA:  Shortness of breath, chest pain EXAM: PORTABLE CHEST 1 VIEW COMPARISON:  11/26/2013 FINDINGS: Small bilateral pleural effusions. Mild bilateral interstitial thickening. No pneumothorax. Stable cardiomediastinal silhouette. Dual lead cardiac pacemaker. No aggressive osseous lesion. IMPRESSION:  Mild pulmonary edema. Electronically Signed   By: Kathreen Devoid   On: 11/04/2018 12:40    ____________________________________________   PROCEDURES  Procedure(s) performed (including Critical Care):  Procedures   ____________________________________________   INITIAL IMPRESSION / ASSESSMENT AND PLAN / ED COURSE  Patient is markedly elevated BNP and chest x-ray to match.  Also his troponins bumped.  We will get him in the hospital regarding his congestive failure and ensure that he rules out.  It may be in is likely that he had a heart attack a month or so ago and was started and is now just recovering.Kevin Gibson was evaluated in Emergency Department on 11/04/2018 for the symptoms described in the history of present illness. He was evaluated in the context of the global COVID-19 pandemic, which necessitated consideration that the patient might be at risk for infection with the SARS-CoV-2  virus that causes COVID-19. Institutional protocols and algorithms that pertain to the evaluation of patients at risk for COVID-19 are in a state of rapid change based on information released by regulatory bodies including the CDC and federal and state organizations. These policies and algorithms were followed during the patient's care in the ED.              ____________________________________________   FINAL CLINICAL IMPRESSION(S) / ED DIAGNOSES  Final diagnoses:  Acute congestive heart failure, unspecified heart failure type ([place])  Elevated troponin     ED Discharge Orders    None       Note:  This document was prepared using Dragon voice recognition software and may include unintentional dictation errors.    Nena Polio, MD 11/04/18 1435

## 2018-11-04 NOTE — Consult Note (Addendum)
ANTICOAGULATION CONSULT NOTE - Follow Up Consult  Pharmacy Consult for Eliquis Indication: DVT  No Known Allergies  Patient Measurements: Height: 5\' 7"  (170.2 cm) Weight: 154 lb 15.7 oz (70.3 kg) IBW/kg (Calculated) : 66.1  Vital Signs: Temp: 96.1 F (35.6 C) (06/15 1139) Temp Source: Oral (06/15 1139) BP: 175/127 (06/15 1139) Pulse Rate: 94 (06/15 1139)  Labs: Recent Labs    11/04/18 1148  HGB 13.6  HCT 40.8  PLT 212  CREATININE 2.13*  TROPONINI 0.05*    Estimated Creatinine Clearance: 27.6 mL/min (A) (by C-G formula based on SCr of 2.13 mg/dL (H)).   Medications:  No prior to admission anticoagulation of note  Assessment: Pharmacy has been consulted for Eliquis dosing for DVT treatment in this 78 y.o. male.  Goal of Therapy:  Monitor platelets by anticoagulation protocol: Yes   Plan:  Will give Eliquis 10mg  bid for 7 days, followed by Eliquis 5mg  bid  Will monitor CBC and Scr a minimum of q3 days per protocol.  Lu Duffel, PharmD, BCPS Clinical Pharmacist 11/04/2018 4:43 PM

## 2018-11-04 NOTE — ED Triage Notes (Signed)
States has had SOB with any exertion since march. States 2 days ago noticed edema legs and groin.

## 2018-11-04 NOTE — Progress Notes (Signed)
Advance care planning  Purpose of Encounter CHF and DVT  Parties in Attendance Patient  Patients Decisional capacity Alert and oriented.  Able to make medical decisions.  Patient's healthcare power of attorney is daughter-schmitt,mary  No ACP documents in place  Discussed in detail regarding CHF.  Treatment plan , prognosis discussed.  All questions answered.   Discussed regarding CODE STATUS.  He tells me he is never thought about it or had conversation with any of his doctors.  Presently is requesting to be a full code with aggressive medical care and would like to discuss further with his family.   Orders entered and CODE STATUS changed  Full code  Time spent - 17 minutes

## 2018-11-04 NOTE — ED Notes (Signed)
ED TO INPATIENT HANDOFF REPORT  ED Nurse Name and Phone #:  Mickel Baas [phone]  S Name/Age/Gender Kevin Gibson 78 y.o. male Room/Bed: ED03A/ED03A  Code Status   Code Status: Full Code  Home/SNF/Other Home Patient oriented to: self, place, time and situation Is this baseline? Yes   Triage Complete: Triage complete  Chief Complaint swollen testicles  Triage Note States has had SOB with any exertion since march. States 2 days ago noticed edema legs and groin.    Allergies No Known Allergies  Level of Care/Admitting Diagnosis ED Disposition    ED Disposition Condition [place] [facility] Area: [place] [[id number]]  Level of Care: Telemetry [5]  Covid Evaluation: N/A  Diagnosis: CHF (congestive heart failure) [facility]) [[id number]]  Admitting Physician: Hillary Bow [[id number]]  Attending Physician: Hillary Bow [[id number]]  Estimated length of stay: past midnight tomorrow  Certification:: I certify this patient will need inpatient services for at least 2 midnights  PT Class (Do Not Modify): Inpatient [101]  PT Acc Code (Do Not Modify): Private [1]       B Medical/Surgery History Past Medical History:  Diagnosis Date  . Chronic kidney disease   . Diabetes mellitus without complication ([place])   . Heart murmur   . Hyperlipidemia   . Hypertension   . Pacemaker   . RBBB   . Syncope and collapse   . 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 PACEMAKER INSERTION    . PERMANENT PACEMAKER INSERTION N/A 11/26/2013   Procedure: PERMANENT PACEMAKER INSERTION;  Surgeon: Deboraha Sprang, MD;  Location: [facility] CATH LAB;  Service: Cardiovascular;  Laterality: N/A;     A IV Location/Drains/Wounds Patient Lines/Drains/Airways Status   Active Line/Drains/Airways    Name:   Placement date:   Placement time:   Site:   Days:   Peripheral IV 11/26/13 Right Wrist   11/26/13    1333    Wrist   1804   Incision (Closed)  11/26/13 Chest Left   11/26/13    1900     1804          Intake/Output Last 24 hours No intake or output data in the 24 hours ending 11/04/18 1640  Labs/Imaging Results for orders placed or performed during the hospital encounter of 11/04/18 (from the past 48 hour(s))  Basic metabolic panel     Status: Abnormal   Collection Time: 11/04/18 11:48 AM  Result Value Ref Range   Sodium 134 (L) 135 - 145 mmol/L   Potassium 4.1 3.5 - 5.1 mmol/L   Chloride 99 98 - 111 mmol/L   CO2 22 22 - 32 mmol/L   Glucose, Bld 437 (H) 70 - 99 mg/dL   BUN 37 (H) 8 - 23 mg/dL   Creatinine, Ser 2.13 (H) 0.61 - 1.24 mg/dL   Calcium 8.9 8.9 - 10.3 mg/dL   GFR calc non Af Amer 29 (L) >60 mL/min   GFR calc Af Amer 34 (L) >60 mL/min   Anion gap 13 5 - 15    Comment: Performed at [facility], [address] [place], [place] [address]  CBC     Status: None   Collection Time: 11/04/18 11:48 AM  Result Value Ref Range   WBC 7.7 4.0 - 10.5 K/uL   RBC 4.57 4.22 - 5.81 MIL/uL   Hemoglobin 13.6 13.0 - 17.0 g/dL   HCT 40.8 39.0 - 52.0 %  MCV 89.3 80.0 - 100.0 fL   MCH 29.8 26.0 - 34.0 pg   MCHC 33.3 30.0 - 36.0 g/dL   RDW 14.2 11.5 - 15.5 %   Platelets 212 150 - 400 K/uL   nRBC 0.0 0.0 - 0.2 %    Comment: Performed at [facility], [address] [place], [place] [address]  Troponin I - ONCE - STAT     Status: Abnormal   Collection Time: 11/04/18 11:48 AM  Result Value Ref Range   Troponin I 0.05 (HH) <0.03 ng/mL    Comment: CRITICAL RESULT CALLED TO, READ BACK BY AND VERIFIED WITH Kalyani Maeda AT 1233 11/04/2018 DAS Performed at [facility], [address] [place], [place] [address]   Brain natriuretic peptide     Status: Abnormal   Collection Time: 11/04/18 11:49 AM  Result Value Ref Range   B Natriuretic Peptide >4,500.0 (H) 0.0 - 100.0 pg/mL    Comment: Performed at [facility], [address], [place], [place] [address]  SARS Coronavirus 2 (CEPHEID -  Performed in [place] hospital lab), Hosp Order     Status: None   Collection Time: 11/04/18  2:38 PM   Specimen: Nasopharyngeal Swab  Result Value Ref Range   SARS Coronavirus 2 NEGATIVE NEGATIVE    Comment: (NOTE) If result is NEGATIVE SARS-CoV-2 target nucleic acids are NOT DETECTED. The SARS-CoV-2 RNA is generally detectable in upper and lower  respiratory specimens during the acute phase of infection. The lowest  concentration of SARS-CoV-2 viral copies this assay can detect is 250  copies / mL. A negative result does not preclude SARS-CoV-2 infection  and should not be used as the sole basis for treatment or other  patient management decisions.  A negative result may occur with  improper specimen collection / handling, submission of specimen other  than nasopharyngeal swab, presence of viral mutation(s) within the  areas targeted by this assay, and inadequate number of viral copies  (<250 copies / mL). A negative result must be combined with clinical  observations, patient history, and epidemiological information. If result is POSITIVE SARS-CoV-2 target nucleic acids are DETECTED. The SARS-CoV-2 RNA is generally detectable in upper and lower  respiratory specimens dur ing the acute phase of infection.  Positive  results are indicative of active infection with SARS-CoV-2.  Clinical  correlation with patient history and other diagnostic information is  necessary to determine patient infection status.  Positive results do  not rule out bacterial infection or co-infection with other viruses. If result is PRESUMPTIVE POSTIVE SARS-CoV-2 nucleic acids MAY BE PRESENT.   A presumptive positive result was obtained on the submitted specimen  and confirmed on repeat testing.  While 2019 novel coronavirus  (SARS-CoV-2) nucleic acids may be present in the submitted sample  additional confirmatory testing may be necessary for epidemiological  and / or clinical management purposes  to  differentiate between  SARS-CoV-2 and other Sarbecovirus currently known to infect humans.  If clinically indicated additional testing with an alternate test  methodology [id number]) is advised. The SARS-CoV-2 RNA is generally  detectable in upper and lower respiratory sp ecimens during the acute  phase of infection. The expected result is Negative. Fact Sheet for Patients:  StrictlyIdeas.no Fact Sheet for Healthcare Providers: BankingDealers.co.za This test is not yet approved or cleared by the [place] FDA and has been authorized for detection and/or diagnosis of SARS-CoV-2 by FDA under an Emergency Use Authorization (EUA).  This EUA will  remain in effect (meaning this test can be used) for the duration of the COVID-19 declaration under Section 564(b)(1) of the Act, 21 U.S.C. section 360bbb-3(b)(1), unless the authorization is terminated or revoked sooner. Performed at [facility], [address] [place], [place] [address]   Glucose, capillary     Status: Abnormal   Collection Time: 11/04/18  4:29 PM  Result Value Ref Range   Glucose-Capillary 383 (H) 70 - 99 mg/dL   US Venous Img Upper Uni Left  Result Date: 11/04/2018 CLINICAL DATA:  Left upper extremity swelling for the past 3 days. Evaluate for DVT. EXAM: LEFT UPPER EXTREMITY VENOUS DOPPLER ULTRASOUND TECHNIQUE: Gray-scale sonography with graded compression, as well as color Doppler and duplex ultrasound were performed to evaluate the upper extremity deep venous system from the level of the subclavian vein and including the jugular, axillary, basilic, radial, ulnar and upper cephalic vein. Spectral Doppler was utilized to evaluate flow at rest and with distal augmentation maneuvers. COMPARISON:  None. FINDINGS: Contralateral Subclavian Vein: Respiratory phasicity is normal and symmetric with the symptomatic side. No evidence of thrombus. Normal compressibility. Internal  Jugular Vein: No evidence of thrombus. Normal compressibility, respiratory phasicity and response to augmentation. Subclavian Vein: No evidence of thrombus. Normal compressibility, respiratory phasicity and response to augmentation. Axillary Vein: No evidence of thrombus. Normal compressibility, respiratory phasicity and response to augmentation. Cephalic Vein: No evidence of thrombus. Normal compressibility, respiratory phasicity and response to augmentation. Basilic Vein: There is mixed echogenic occlusive thrombus seen throughout the interrogated course of the slightly atrophic left basilic vein (images 22 through 26). Brachial Veins: No evidence of thrombus. Normal compressibility, respiratory phasicity and response to augmentation. Radial Veins: No evidence of thrombus. Normal compressibility, respiratory phasicity and response to augmentation. Ulnar Veins: No evidence of thrombus. Normal compressibility, respiratory phasicity and response to augmentation. Venous Reflux:  None visualized. Other Findings: Moderate amount of subcutaneous edema is noted throughout the left upper extremity. IMPRESSION: 1. No evidence of DVT within the left lower extremity. 2. 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 Again, there is no extension of this age-indeterminate SVT to the deep venous system of the left upper extremity. Electronically Signed   By: Sandi Mariscal M.D.   On: 11/04/2018 14:54   Dg Chest Port 1 View  Result Date: 11/04/2018 CLINICAL DATA:  Shortness of breath, chest pain EXAM: PORTABLE CHEST 1 VIEW COMPARISON:  11/26/2013 FINDINGS: Small bilateral pleural effusions. Mild bilateral interstitial thickening. No pneumothorax. Stable cardiomediastinal silhouette. Dual lead cardiac pacemaker. No aggressive osseous lesion. IMPRESSION: Mild pulmonary edema. Electronically Signed   By: Kathreen Devoid   On: 11/04/2018 12:40     Pending Labs Unresulted Labs (From admission, onward)    Start     Ordered   11/05/18 1017  Basic metabolic panel  Tomorrow morning,   STAT     11/04/18 1633   11/05/18 0500  CBC  Tomorrow morning,   STAT     11/04/18 1633   11/04/18 1634  Hemoglobin A1c  Add-on,   AD    Comments: To assess prior glycemic control    11/04/18 1633          Vitals/Pain Today's Vitals   11/04/18 1133 11/04/18 1139  BP:  (!) 175/127  Pulse:  94  Resp:  20  Temp:  (!) 96.1 F (35.6 C)  TempSrc:  Oral  SpO2:  93%  Weight: 70.3 kg  Height: 5\' 7"  (1.702 m)   PainSc: 0-No pain     Isolation Precautions No active isolations  Medications Medications  sodium chloride flush (NS) 0.9 % injection 3 mL (has no administration in time range)  hydrALAZINE (APRESOLINE) injection 10 mg (has no administration in time range)  sodium chloride flush (NS) 0.9 % injection 3 mL (has no administration in time range)  acetaminophen (TYLENOL) tablet 650 mg (has no administration in time range)    Or  acetaminophen (TYLENOL) suppository 650 mg (has no administration in time range)  polyethylene glycol (MIRALAX / GLYCOLAX) packet 17 g (has no administration in time range)  ondansetron (ZOFRAN) tablet 4 mg (has no administration in time range)    Or  ondansetron (ZOFRAN) injection 4 mg (has no administration in time range)  albuterol (PROVENTIL) (2.5 MG/3ML) 0.083% nebulizer solution 2.5 mg (has no administration in time range)  insulin aspart (novoLOG) injection 0-15 Units (has no administration in time range)  insulin aspart (novoLOG) injection 0-5 Units (has no administration in time range)  furosemide (LASIX) injection 60 mg (has no administration in time range)  insulin glargine (LANTUS) injection 18 Units (has no administration in time range)  insulin aspart (novoLOG) injection 10 Units (has no administration in time range)  furosemide (LASIX) injection 20 mg (20 mg Intravenous Given 11/04/18 1312)     Mobility walks Low fall risk   Focused Assessments Cardiac Assessment Handoff:  Cardiac Rhythm: Ventricular paced Lab Results  Component Value Date   TROPONINI 0.05 (HH) 11/04/2018   No results found for: DDIMER Does the Patient currently have chest pain? No     R Recommendations: See Admitting Provider Note  Report given to:   Additional Notes:

## 2018-11-05 ENCOUNTER — Inpatient Hospital Stay (HOSPITAL_COMMUNITY)
Admit: 2018-11-05 | Discharge: 2018-11-05 | Disposition: A | Discharge status: Home / Self Care | Payer: Medicare HMO | Attending: Internal Medicine | Admitting: Internal Medicine

## 2018-11-05 DIAGNOSIS — I340 Nonrheumatic mitral (valve) insufficiency: Secondary | ICD-10-CM

## 2018-11-05 DIAGNOSIS — I361 Nonrheumatic tricuspid (valve) insufficiency: Secondary | ICD-10-CM

## 2018-11-05 LAB — GLUCOSE, CAPILLARY
Glucose-Capillary: 61 mg/dL — ABNORMAL LOW (ref 70–99)
Glucose-Capillary: 81 mg/dL (ref 70–99)
Glucose-Capillary: 80 mg/dL (ref 70–99)
Glucose-Capillary: 103 mg/dL — ABNORMAL HIGH (ref 70–99)
Glucose-Capillary: 144 mg/dL — ABNORMAL HIGH (ref 70–99)

## 2018-11-05 LAB — CBC
HCT: 35.4 % — ABNORMAL LOW (ref 39.0–52.0)
Hemoglobin: 11.6 g/dL — ABNORMAL LOW (ref 13.0–17.0)
MCH: 30.1 pg (ref 26.0–34.0)
MCHC: 32.8 g/dL (ref 30.0–36.0)
MCV: 91.9 fL (ref 80.0–100.0)
Platelets: 166 10*3/uL (ref 150–400)
RBC: 3.85 MIL/uL — ABNORMAL LOW (ref 4.22–5.81)
RDW: 14.2 % (ref 11.5–15.5)
WBC: 5.6 10*3/uL (ref 4.0–10.5)
nRBC: 0 % (ref 0.0–0.2)

## 2018-11-05 LAB — BASIC METABOLIC PANEL
Anion gap: 8 (ref 5–15)
BUN: 38 mg/dL — ABNORMAL HIGH (ref 8–23)
CO2: 25 mmol/L (ref 22–32)
Calcium: 8.4 mg/dL — ABNORMAL LOW (ref 8.9–10.3)
Chloride: 106 mmol/L (ref 98–111)
Creatinine, Ser: 2.23 mg/dL — ABNORMAL HIGH (ref 0.61–1.24)
GFR calc Af Amer: 32 mL/min — ABNORMAL LOW (ref >60)
GFR calc non Af Amer: 28 mL/min — ABNORMAL LOW (ref >60)
Glucose, Bld: 136 mg/dL — ABNORMAL HIGH (ref 70–99)
Potassium: 3.8 mmol/L (ref 3.5–5.1)
Sodium: 139 mmol/L (ref 135–145)

## 2018-11-05 LAB — ECHOCARDIOGRAM COMPLETE
Height: 67 in
Weight: 2651.2 oz

## 2018-11-05 MED ORDER — LIVING WELL WITH DIABETES BOOK
Freq: Once | Status: DC
  Filled 2018-11-05: qty 1

## 2018-11-05 MED ORDER — INSULIN STARTER KIT- SYRINGES (ENGLISH)
1.0000 | Freq: Once | Status: DC
  Filled 2018-11-05: qty 1

## 2018-11-05 NOTE — Plan of Care (Signed)
  Problem: Clinical Measurements: Goal: Respiratory complications will improve Outcome: Progressing   Problem: Activity: Goal: Risk for activity intolerance will decrease Outcome: Progressing   Problem: Education: Goal: Ability to verbalize understanding of medication therapies will improve Outcome: Progressing

## 2018-11-05 NOTE — Progress Notes (Signed)
Cardiac Rehab Navigator/ Exercise Physiologist Note  CHF Education:?? Educational session with patient completed.  Provided patient with "Living Better with Heart Failure" packet. Briefly reviewed definition of heart failure and signs and symptoms of an exacerbation. Discussed potential causes of CHF. Explained to patient that HF is a chronic illness which requires self-assessment / self-management along with help from the cardiologist/PCP. Discussed definition of EF measurement along with normal value compared to patient's EF 20-25%.  ? *Reviewed importance of and reason behind checking weight daily in the AM, after using the bathroom, but before getting dressed. Patient does not have a functioning scale at home. This EP will consult with Case Management about home scales for patient.  *Reviewed with patient the following information: *Discussed when to call the Dr= weight gain of >2-3lb overnight of 5lb in a week,  *Discussed yellow zone= call MD: weight gain of >2-3lb overnight of 5lb in a week, increased swelling, increased SOB when lying down, chest discomfort, dizziness, increased fatigue *Red Zone= call 911: struggle to breath, fainting or near fainting, significant chest pain ?  *Heart Failure Zone Magnet given and reviewed with patient.   ? *Diet - Patient currently ordered carb modified. Referral for Dietitian Consultation for diet education has been ordered. Instructed patient to follow a low sodium diet of 2000 mg or less.  Recommended foods for low sodium heart healthy nutrition or heart failure carb modified nutrition therapy discussed. Reviewed with patient steps to reading a food label with close attention to serving size and mg of sodium.   ? *Discussed fluid intake with patient as well. Patient on fluid restriction currently on 1500  ml fluid restriction.  Demonstrated this volume to patient using the bedside water pitcher.   ? *Instructed patient to take medications as  prescribed for heart failure. Explained briefly why patient is on the medications (either make you feel better, live longer or keep you out of the hospital) and discussed monitoring and side effects.  ? *Discussed exercise / activity. Patient is currently sedentary. Patient has swelling in hands, feet, and ankles with shortness of breath at rest.   Encouraged patient to be as active as possible. Patient became very sleeping during education. This EP will round on patient again and further educate after patient has rested.  *Smoking Cessation- Patient is a NEVER smoker.  ? *[facility] - Explained the purpose of the HF Clinic. Explained to patient the HF Clinic does not replace PCP nor Cardiologist, but is an additional resource to helping patient manage heart failure at home. Patient's appointment in the [facility] is scheduled for 12/04/18 at 9:20 pm.   ? Again, the 5 Steps to Living Better with Heart Failure were reviewed with patient.   Patient thanked me for providing the above information. ?  Jasper Loser, [facility]  Exercise Physiologist Department Phone #: [phone] Fax: [phone]  Direct Line [phone] Email Address: [email or web address]

## 2018-11-05 NOTE — TOC Benefit Eligibility Note (Signed)
Transition of Care [facility]) Benefit Eligibility Note    Patient Details  Name: Kevin Gibson MRN: [id number] Date of Birth: June 27, 1940   Medication/Dose: Eliquis 61m BID for 7 days, then 530mBID  Covered?: Yes  Tier: 3 Drug  Prescription Coverage Preferred [facility]: [facility] Person/Company/Phone Number:: Darian with [employer] at [phone]-Pay: $141 estimated copay for 3 month supply or $47 estimated copay for 1 month supply.  Prior Approval: No  Deductible: Unmet($195 deductible, $0 met as of time of call.)   HoDannette [place]bar[place]one Number: [id number] [phone]/16/2020, 3:37 PM

## 2018-11-05 NOTE — Progress Notes (Signed)
*  PRELIMINARY RESULTS* Echocardiogram 2D Echocardiogram has been performed.  Kevin Gibson 11/05/2018, 9:26 AM

## 2018-11-05 NOTE — Progress Notes (Signed)
[place] [facility] [place] NAME: Kevin Gibson    MR#:  [id number]  DATE OF BIRTH:  05/27/1940  SUBJECTIVE:  CHIEF COMPLAINT: Patient shortness of breath is relatively better reports chronic lower extremity edema and left upper extremity swelling  REVIEW OF SYSTEMS:  CONSTITUTIONAL: No fever, fatigue or weakness.  EYES: No blurred or double vision.  EARS, NOSE, AND THROAT: No tinnitus or ear pain.  RESPIRATORY: No cough, shortness of breath, wheezing or hemoptysis.  CARDIOVASCULAR: No chest pain, orthopnea, edema.  GASTROINTESTINAL: No nausea, vomiting, diarrhea or abdominal pain.  GENITOURINARY: No dysuria, hematuria.  ENDOCRINE: No polyuria, nocturia,  HEMATOLOGY: No anemia, easy bruising or bleeding SKIN: No rash or lesion. MUSCULOSKELETAL: No joint pain or arthritis.   NEUROLOGIC: No tingling, numbness, weakness.  PSYCHIATRY: No anxiety or depression.   DRUG ALLERGIES:  No Known Allergies  VITALS:  Blood pressure 103/67, pulse (!) 59, temperature (!) 97.5 F (36.4 C), temperature source Oral, resp. rate 16, height 5\' 7"  (1.702 m), weight 75.2 kg, SpO2 100 %.  PHYSICAL EXAMINATION:  GENERAL:  78 y.o.-year-old patient lying in the bed with no acute distress.  EYES: Pupils equal, round, reactive to light and accommodation. No scleral icterus. Extraocular muscles intact.  HEENT: Head atraumatic, normocephalic. Oropharynx and nasopharynx clear.  NECK:  Supple, no jugular venous distention. No thyroid enlargement, no tenderness.  LUNGS: Normal breath sounds bilaterally, no wheezing, rales,rhonchi or crepitation. No use of accessory muscles of respiration.  CARDIOVASCULAR: S1, S2 normal. No murmurs, rubs, or gallops.  ABDOMEN: Soft, nontender, nondistended. Bowel sounds present. No organomegaly or mass.  EXTREMITIES: No pedal edema, cyanosis, or clubbing.  NEUROLOGIC: Cranial nerves II through XII are intact. Muscle strength 5/5 in all  extremities. Sensation intact. Gait not checked.  PSYCHIATRIC: The patient is alert and oriented x 3.  SKIN: No obvious rash, lesion, or ulcer.    LABORATORY PANEL:   CBC Recent Labs  Lab 11/05/18 0411  WBC 5.6  HGB 11.6*  HCT 35.4*  PLT 166   ------------------------------------------------------------------------------------------------------------------  Chemistries  Recent Labs  Lab 11/05/18 0411  NA 139  K 3.8  CL 106  CO2 25  GLUCOSE 136*  BUN 38*  CREATININE 2.23*  CALCIUM 8.4*   ------------------------------------------------------------------------------------------------------------------  Cardiac Enzymes Recent Labs  Lab 11/04/18 1148  TROPONINI 0.05*   ------------------------------------------------------------------------------------------------------------------  RADIOLOGY:  US Venous Img Upper Uni Left  Result Date: 11/04/2018 CLINICAL DATA:  Left upper extremity swelling for the past 3 days. Evaluate for DVT. EXAM: LEFT UPPER EXTREMITY VENOUS DOPPLER ULTRASOUND TECHNIQUE: Gray-scale sonography with graded compression, as well as color Doppler and duplex ultrasound were performed to evaluate the upper extremity deep venous system from the level of the subclavian vein and including the jugular, axillary, basilic, radial, ulnar and upper cephalic vein. Spectral Doppler was utilized to evaluate flow at rest and with distal augmentation maneuvers. COMPARISON:  None. FINDINGS: Contralateral Subclavian Vein: Respiratory phasicity is normal and symmetric with the symptomatic side. No evidence of thrombus. Normal compressibility. Internal Jugular Vein: No evidence of thrombus. Normal compressibility, respiratory phasicity and response to augmentation. Subclavian Vein: No evidence of thrombus. Normal compressibility, respiratory phasicity and response to augmentation. Axillary Vein: No evidence of thrombus. Normal compressibility, respiratory phasicity and response  to augmentation. Cephalic Vein: No evidence of thrombus. Normal compressibility, respiratory phasicity and response to augmentation. Basilic Vein: There is mixed echogenic occlusive thrombus seen throughout the interrogated course of the slightly atrophic left basilic vein (  images 22 through 26). Brachial Veins: No evidence of thrombus. Normal compressibility, respiratory phasicity and response to augmentation. Radial Veins: No evidence of thrombus. Normal compressibility, respiratory phasicity and response to augmentation. Ulnar Veins: No evidence of thrombus. Normal compressibility, respiratory phasicity and response to augmentation. Venous Reflux:  None visualized. Other Findings: Moderate amount of subcutaneous edema is noted throughout the left upper extremity. IMPRESSION: 1. No evidence of DVT within the left lower extremity. 2. 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 Again, there is no extension of this age-indeterminate SVT to the deep venous system of the left upper extremity. Electronically Signed   By: Sandi Mariscal M.D.   On: 11/04/2018 14:54   Dg Chest Port 1 View  Result Date: 11/04/2018 CLINICAL DATA:  Shortness of breath, chest pain EXAM: PORTABLE CHEST 1 VIEW COMPARISON:  11/26/2013 FINDINGS: Small bilateral pleural effusions. Mild bilateral interstitial thickening. No pneumothorax. Stable cardiomediastinal silhouette. Dual lead cardiac pacemaker. No aggressive osseous lesion. IMPRESSION: Mild pulmonary edema. Electronically Signed   By: Kathreen Devoid   On: 11/04/2018 12:40    EKG:   Orders placed or performed during the hospital encounter of 11/04/18  . EKG 12-Lead  . EKG 12-Lead  . ED EKG  . ED EKG    ASSESSMENT AND PLAN:   *Acute on chronic diastolic congestive heart failure.  Last echocardiogram was in 2015. Clinically feeling better  IV Lasix 60 mg twice daily.  Monitor input and  output.  Repeat BUN and creatinine in the morning. Patient is on labetalol at home that have continued. Monitor daily weights intake and output Teds Keep legs elevated Repeat echocardiogram  *CKD stage III.  Baseline creatinine at 2.  Close to his baseline at 2.13 today.  Monitor while being diuresed.  Repeat labs in the morning  *Diabetes mellitus.  Insulin-dependent. -CBGs were soft Patient has stopped taking insulin as he was unable to afford for the past few months.  Blood sugars at 427.  Will give stat dose of NovoLog.   Discontinue Lantus.  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  But symptoms started 5 days back.  This can be monitored as outpatient.  But due to his symptoms and significant swelling patient is started on Eliquis.      All the records are reviewed and case discussed with Care Management/Social Workerr. Management plans discussed with the patient, family and they are in agreement.  CODE STATUS: fc  TOTAL TIME TAKING CARE OF THIS PATIENT: 39  minutes.   POSSIBLE D/C IN 2-3 DAYS, DEPENDING ON CLINICAL CONDITION.  Note: This dictation was prepared with Dragon dictation along with smaller phrase technology. Any transcriptional errors that result from this process are unintentional.   Nicholes Mango M.D on 11/05/2018 at 4:39 PM  Between 7am to 6pm - Pager - [phone] After 6pm go to [email or web address] - password [email or web address] [facility] [place]  [place] [facility]  Office  [phone]  CC: Primary care physician; Kirk Ruths, MD

## 2018-11-05 NOTE — Progress Notes (Signed)
Physical Therapy Evaluation Patient Details Name: Kevin Gibson MRN: [id number] DOB: 09/14/1940 Today's Date: 11/05/2018   History of Present Illness  Per MD note:Kevin Gibson  is a 78 y.o. 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  He has noticed significant swelling and bilateral lower extremities and right upper extremity in the last 5 days.  No history of DVT.  Patient here has been found to have pulmonary edema on chest x-ray, significantly elevated BNP of greater than 4500.  Oxygen saturations at times dropped into the high 80s.  Patient is being admitted for CHF needing IV diuresis as inpatient.  Blood sugars found to be 427.  Patient stopped taking his insulin a few months back.  Clinical Impression  Patient presents awake in bed. He has -3/5 strength BLE and 3/5 strength BUE. He needs min assist for supine <> sit bed mobility, min assist for sit to stand transfer with Rw and MI with Rw on room air for ambulation 20 feet without O2 saturation dropping below 95%. Patient is fatigued with walking and is breathing heavy but has good O2 saturation. He was able to perform BLE LAQ, marching 15 x 2 sets on room air with  O2 saturation above 95 %.   Follow Up Recommendations Home health PT    Equipment Recommendations  Rolling walker with 5" wheels    Recommendations for Other Services       Precautions / Restrictions Precautions Precautions: None Restrictions Weight Bearing Restrictions: No      Mobility  Bed Mobility Overal bed mobility: Modified Independent             General bed mobility comments: needs assist with  BLE sit to supine  Transfers Overall transfer level: Modified independent Equipment used: Rolling walker (2 wheeled)                 Ambulation/Gait Ambulation/Gait assistance: Modified independent (Device/Increase time) Gait Distance (Feet): 20 Feet Assistive device: Rolling walker (2 wheeled)          Stairs            Wheelchair Mobility    Modified Rankin (Stroke Patients Only)       Balance Overall balance assessment: Mild deficits observed, not formally tested                                           Pertinent Vitals/Pain Pain Assessment: No/denies pain    Home Living Family/patient expects to be discharged to:: Private residence Living Arrangements: Other relatives(step son) Available Help at Discharge: Family Type of Home: Mobile home Home Access: Ramped entrance     Home Layout: One level        Prior Function Level of Independence: Needs assistance   Gait / Transfers Assistance Needed: (ambulates without AD independently)           Hand Dominance        Extremity/Trunk Assessment   Upper Extremity Assessment Upper Extremity Assessment: Generalized weakness    Lower Extremity Assessment Lower Extremity Assessment: Generalized weakness       Communication      Cognition Arousal/Alertness: Awake/alert Behavior During Therapy: WFL for tasks assessed/performed  General Comments      Exercises General Exercises - Lower Extremity Long Arc Quad: Right;Left;15 reps Hip Flexion/Marching: Right;Left;Both;15 reps   Assessment/Plan    PT Assessment Patient needs continued PT services  PT Problem List         PT Treatment Interventions Gait training;Functional mobility training;Therapeutic activities;Therapeutic exercise    PT Goals (Current goals can be found in the Care Plan section)  Acute Rehab PT Goals Patient Stated Goal: to get stronger PT Goal Formulation: With patient Time For Goal Achievement: 11/19/18 Potential to Achieve Goals: Good    Frequency Min 2X/week    Barriers to discharge        Co-evaluation               AM-PAC PT "6 Clicks" Mobility  Outcome Measure Help needed turning from your back to your side while in a flat bed without using bedrails?: A Little Help needed moving from lying on your back to sitting on the side of a flat bed without using bedrails?: A Little Help needed moving to and from a bed to a chair (including a wheelchair)?: A Little Help needed standing up from a chair using your arms (e.g., wheelchair or bedside chair)?: A Little Help needed to walk in hospital room?: A Little Help needed climbing 3-5 steps with a railing? : A Lot 6 Click Score: 17    End of Session Equipment Utilized During Treatment: Gait belt;Oxygen Activity Tolerance: Patient limited by fatigue Patient left: in bed;with bed alarm set   PT Visit Diagnosis: Muscle weakness (generalized) (M62.81);Difficulty in walking, not elsewhere classified (R26.2)    Time: 1351-1415 PT Time Calculation (min) (ACUTE ONLY): 24 min   Charges:   PT Evaluation $PT Eval Low Complexity: 1 Low PT Treatments $Therapeutic Exercise: 8-22 mins          Alanson Puls, PT DPT 11/05/2018, 5:47 PM

## 2018-11-05 NOTE — Progress Notes (Addendum)
Inpatient Diabetes Program Recommendations  AACE/ADA: New Consensus Statement on Inpatient Glycemic Control (2015)  Target Ranges:  Prepandial:   less than 140 mg/dL      Peak postprandial:   less than 180 mg/dL (1-2 hours)      Critically ill patients:  140 - 180 mg/dL   Lab Results  Component Value Date   GLUCAP 81 11/05/2018   HGBA1C 12.1 (H) 11/04/2018    Review of Glycemic Control Results for Kevin Gibson, Kevin Gibson (MRN [id number]) as of 11/05/2018 10:08  Ref. Range 11/04/2018 18:11 11/04/2018 20:27 11/05/2018 07:19 11/05/2018 08:09  Glucose-Capillary Latest Ref Range: 70 - 99 mg/dL 363 (H) 270 (H) 61 (L) 81   Diabetes history: DM Outpatient Diabetes medications:  None Current orders for Inpatient glycemic control:  Novolog moderate tid with meals and HS Lantus 18 units daily Inpatient Diabetes Program Recommendations:    Blood sugar low this morning, however patient received a total of 28 units of Novolog yesterday evening.  It appears that he has been on 70/30 insulin in the past, however was not taking recently.  Will talk to patient regarding why insulin was stopped? If cost is an issue, may need to switch to 70/30 that can be purchased from [place] for 25 $ a vial ? A1C indicates poor control of DM prior to admit with an average CBG of 300 mg/dL.   Thanks,  Adah Perl, RN, BC-ADM Inpatient Diabetes Coordinator Pager [phone] (8a-5p)  1450 Addendum:  Spoke to patient regarding home DM management.  He states that he stopped taking insulin over 8 months ago due to cost.   He states that the insulin was costing approximately 37$ a vial.  Asked if he could afford 25$ a vial from [facility] and he states "yes".  He knows how to draw up and administer insulin (Will ask RN to review with him as well).  Reviewed results of A1C and explained that his blood sugars were averaging around 300 mg/dL.  He states that when he takes his insulin, his blood sugars were in the 80's.  We reviewed signs  and symptoms of low blood sugars and how to treat.  Patient was able to verbalize understanding.  Explained to patient that he likely does need insulin at again at d/c and he agreed.  Will order care management consult regarding cost and affordability of insulins? He has meter at home.

## 2018-11-05 NOTE — TOC Initial Note (Signed)
Transition of Care [facility]) - Initial/Assessment Note    Patient Details  Name: Kevin Gibson MRN: [id number] Date of Birth: September 03, 1940  [facility] CM/SW Contact:    [name], LCSW Phone Number: 11/05/2018, 5:47 PM  Clinical Narrative:                  Patient is a 78 year old male who is alert and oriented x4.  Patient states he lives with his stepson, and he does not have any problems getting to MD appointments or getting his medication.  Patient stated his house has some leaks in it, and he is going to look in trying to find public housing through [place].  CSW provided patient phone number for [facility], also provided patient with a 30 day free coupon of Eliquis.  CSW informed patient that after his coupon has been used, then his medication will have a $195 deductible, then $47 a month.  Patient said it was pretty expensive, CSW informed him that he should speak to the Cardiologist about seeing if there is something different he can use after his free month is up.  CSW also provided patient with a scale so he can weigh himself.  CSW asked patient if he would be open to home health if needed and he said yes.  CSW to continue to facilitate discharge planning for patient.  Expected Discharge Plan: Home/Self Care Barriers to Discharge: Continued Medical Work up   Patient Goals and CMS Choice Patient states their goals for this hospitalization and ongoing recovery are:: To return home with home health. CMS Medicare.gov Compare Post Acute Care list provided to:: Patient Choice offered to / list presented to : Patient  Expected Discharge Plan and Services Expected Discharge Plan: Home/Self Care In-house Referral: Clinical Social Work Discharge Planning Services: Other - See comment(Scale provided for patient)   Living arrangements for the past 2 months: Single Family Home Expected Discharge Date: 11/06/18               DME Arranged: N/A DME Agency: NA        HH Arranged: NA HH Agency: NA        Prior Living Arrangements/Services Living arrangements for the past 2 months: Single Family Home Lives with:: Adult Children Patient language and need for interpreter reviewed:: Yes Do you feel safe going back to the place where you live?: Yes      Need for Family Participation in Patient Care: No (Comment) Care giver support system in place?: No (comment)   Criminal Activity/Legal Involvement Pertinent to Current Situation/Hospitalization: No - Comment as needed  Activities of Daily Living Home Assistive Devices/Equipment: Dentures (specify type), Eyeglasses, CBG Meter, Blood pressure cuff ADL Screening (condition at time of admission) Patient's cognitive ability adequate to safely complete daily activities?: Yes Is the patient deaf or have difficulty hearing?: No Does the patient have difficulty seeing, even when wearing glasses/contacts?: No Does the patient have difficulty concentrating, remembering, or making decisions?: Yes Patient able to express need for assistance with ADLs?: Yes Does the patient have difficulty dressing or bathing?: Yes Independently performs ADLs?: No Communication: Independent Dressing (OT): Needs assistance Is this a change from baseline?: Pre-admission baseline Grooming: Needs assistance Is this a change from baseline?: Pre-admission baseline Feeding: Independent Bathing: Needs assistance Is this a change from baseline?: Pre-admission baseline Toileting: Needs assistance Is this a change from baseline?: Pre-admission baseline In/Out Bed: Needs assistance Is this a change from baseline?: Pre-admission  baseline Walks in Home: Independent Does the patient have difficulty walking or climbing stairs?: Yes Weakness of Legs: Both Weakness of Arms/Hands: None  Permission Sought/Granted Permission sought to share information with : Case Manager, Family Supports Permission granted to share information with : Yes,  Verbal Permission Granted  Share Information with NAME: schmitt,mary Daughter   [phone]           Emotional Assessment Appearance:: Appears stated age   Affect (typically observed): Calm, Accepting, Pleasant, Stable Orientation: : Oriented to Self, Oriented to Place, Oriented to  Time, Oriented to Situation Alcohol / Substance Use: Not Applicable Psych Involvement: No (comment)  Admission diagnosis:  SOB (shortness of breath) [R06.02] Elevated troponin [R79.89] Acute congestive heart failure, unspecified heart failure type [facility]) [I50.9] Patient Active Problem List   Diagnosis Date Noted  . CHF (congestive heart failure) ([place]) 11/04/2018  . Pacemaker-medtronic  MRI compatible 11/27/2013  . Hypertension complicating diabetes ([place]) 11/27/2013  . Diabetes mellitus with chronic kidney disease ([place]) 11/27/2013  . 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

## 2018-11-06 LAB — BASIC METABOLIC PANEL
Anion gap: 8 (ref 5–15)
BUN: 43 mg/dL — ABNORMAL HIGH (ref 8–23)
CO2: 26 mmol/L (ref 22–32)
Calcium: 8.3 mg/dL — ABNORMAL LOW (ref 8.9–10.3)
Chloride: 106 mmol/L (ref 98–111)
Creatinine, Ser: 2.35 mg/dL — ABNORMAL HIGH (ref 0.61–1.24)
GFR calc Af Amer: 30 mL/min — ABNORMAL LOW (ref >60)
GFR calc non Af Amer: 26 mL/min — ABNORMAL LOW (ref >60)
Glucose, Bld: 82 mg/dL (ref 70–99)
Potassium: 3.5 mmol/L (ref 3.5–5.1)
Sodium: 140 mmol/L (ref 135–145)

## 2018-11-06 LAB — GLUCOSE, CAPILLARY
Glucose-Capillary: 70 mg/dL (ref 70–99)
Glucose-Capillary: 135 mg/dL — ABNORMAL HIGH (ref 70–99)
Glucose-Capillary: 172 mg/dL — ABNORMAL HIGH (ref 70–99)
Glucose-Capillary: 218 mg/dL — ABNORMAL HIGH (ref 70–99)
Glucose-Capillary: 205 mg/dL — ABNORMAL HIGH (ref 70–99)

## 2018-11-06 MED ORDER — FUROSEMIDE 10 MG/ML IJ SOLN
80.0000 mg | Freq: Two times a day (BID) | INTRAMUSCULAR | Status: DC
  Administered 2018-11-06: 80 mg via INTRAVENOUS
  Filled 2018-11-06: qty 8

## 2018-11-06 NOTE — Progress Notes (Signed)
[facility] [place] NAME: Kevin Gibson    MR#:  [id number]  DATE OF BIRTH:  09/25/1940  SUBJECTIVE:  CHIEF COMPLAINT: Patient shortness of breath is  better reports swelling in his extremities is improving  REVIEW OF SYSTEMS:  CONSTITUTIONAL: No fever, fatigue or weakness.  EYES: No blurred or double vision.  EARS, NOSE, AND THROAT: No tinnitus or ear pain.  RESPIRATORY: No cough, shortness of breath, wheezing or hemoptysis.  CARDIOVASCULAR: No chest pain, orthopnea, edema.  GASTROINTESTINAL: No nausea, vomiting, diarrhea or abdominal pain.  GENITOURINARY: No dysuria, hematuria.  ENDOCRINE: No polyuria, nocturia,  HEMATOLOGY: No anemia, easy bruising or bleeding SKIN: No rash or lesion. MUSCULOSKELETAL: No joint pain or arthritis.   NEUROLOGIC: No tingling, numbness, weakness.  PSYCHIATRY: No anxiety or depression.   DRUG ALLERGIES:  No Known Allergies  VITALS:  Blood pressure 114/73, pulse (!) 59, temperature 97.9 F (36.6 C), temperature source Oral, resp. rate 19, height 5\' 7"  (1.702 m), weight 73 kg, SpO2 99 %.  PHYSICAL EXAMINATION:  GENERAL:  78 y.o.-year-old patient lying in the bed with no acute distress.  EYES: Pupils equal, round, reactive to light and accommodation. No scleral icterus. Extraocular muscles intact.  HEENT: Head atraumatic, normocephalic. Oropharynx and nasopharynx clear.  NECK:  Supple, no jugular venous distention. No thyroid enlargement, no tenderness.  LUNGS: Normal breath sounds bilaterally, no wheezing, rales,rhonchi or crepitation. No use of accessory muscles of respiration.  CARDIOVASCULAR: S1, S2 normal. No murmurs, rubs, or gallops.  ABDOMEN: Soft, nontender, nondistended. Bowel sounds present. EXTREMITIES: Significant pedal edema and left upper extremity pitting edema, no cyanosis, or clubbing.  NEUROLOGIC: Cranial nerves II through XII are intact. Muscle strength 5/5 in all extremities.  Sensation intact. Gait not checked.  PSYCHIATRIC: The patient is alert and oriented x 3.  SKIN: No obvious rash, lesion, or ulcer.    LABORATORY PANEL:   CBC Recent Labs  Lab 11/05/18 0411  WBC 5.6  HGB 11.6*  HCT 35.4*  PLT 166   ------------------------------------------------------------------------------------------------------------------  Chemistries  Recent Labs  Lab 11/06/18 0544  NA 140  K 3.5  CL 106  CO2 26  GLUCOSE 82  BUN 43*  CREATININE 2.35*  CALCIUM 8.3*   ------------------------------------------------------------------------------------------------------------------  Cardiac Enzymes Recent Labs  Lab 11/04/18 1148  TROPONINI 0.05*   ------------------------------------------------------------------------------------------------------------------  RADIOLOGY:  No results found.  EKG:   Orders placed or performed during the hospital encounter of 11/04/18  . EKG 12-Lead  . EKG 12-Lead  . ED EKG  . ED EKG    ASSESSMENT AND PLAN:   *Acute on chronic diastolic congestive heart failure.  Last echocardiogram was in 2015. Clinically feeling better  IV Lasix 60 mg twice daily.  Monitor input and output.  Repeat BUN and creatinine in the morning.  Will increase to Lasix 80 mg twice a day as recommended by cardiology  Myoview stress test in a.m. Patient is on labetalol at home that have continued. Monitor daily weights intake and output Teds Keep legs elevated Repeat echocardiogram Appreciate Dr. Montine Circle recommendations  *CKD stage III.  Baseline creatinine at 2.  1  Baseline creatinine 2.1creatinine at 2.35   Monitor while being diuresed.  Repeat labs in the morning A.m. labs  *Diabetes mellitus.  Insulin-dependent. -CBGs were soft Patient has stopped taking insulin as he was unable to afford for the past few months.  Blood sugars at 427.  Will give stat dose of NovoLog.   Discontinue  Lantus.  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  But symptoms started 5 days back.  This can be monitored as outpatient.  But due to his symptoms and significant swelling patient is started on Eliquis.      All the records are reviewed and case discussed with Care Management/Social Workerr. Management plans discussed with the patient, he is  in agreement.  CODE STATUS: fc  TOTAL TIME TAKING CARE OF THIS PATIENT: 39  minutes.   POSSIBLE D/C IN 2-3 DAYS, DEPENDING ON CLINICAL CONDITION.  Note: This dictation was prepared with Dragon dictation along with smaller phrase technology. Any transcriptional errors that result from this process are unintentional.   Nicholes Mango M.D on 11/06/2018 at 2:34 PM  Between 7am to 6pm - Pager - [phone] After 6pm go to [email or web address] - password [email or web address] [facility]  [facility]  Office  [phone]  CC: Primary care physician; Kirk Ruths, MD

## 2018-11-06 NOTE — Progress Notes (Signed)
Cardiac Rehab Navigator/ Exercise Physiologist Note  This EP provided education for patient yesterday, during that time patient was very groggy. This EP found patient very alert and inquisitve today. Patient was eager to learn and wanted to understand why he was so short of breath before he came to the ED. This EP reviewed all of the education from yesterday.  CHF Education:??  Educational session with patient completed.  Provided patient with "Living Better with Heart Failure" packet. Briefly reviewed definition of heart failure and signs and symptoms of an exacerbation. Discussed potential causes of CHF.  Explained to patient that HF is a chronic illness which requires self-assessment / self-management along with help from the cardiologist/PCP. Discussed definition of EF measurement along with normal value compared to patient's EF 20-25%.   *Reviewed importance of and reason behind checking weight daily in the AM, after using the bathroom, but before getting dressed. Case Manager has delivered scales to patients room. Patient voiced understanding of the importance of weighing everyday.  *Reviewed with patient the following information:  *Discussed when to call the Dr= weight gain of >2-3lb overnight of 5lb in a week,  *Discussed yellow zone= call MD: weight gain of >2-3lb overnight of 5lb in a week, increased swelling, increased SOB when lying down, chest discomfort, dizziness, increased fatigue  *Red Zone= call 911: struggle to breath, fainting or near fainting, significant chest pain ?  *Heart Failure Zone Magnet given and reviewed with patient. ?  *Diet - Patient currently ordered carb modified. Referral for Dietitian Consultation for diet education has been ordered. Instructed patient to follow a low sodium diet of 2000 mg or less. Recommended foods for heart failure carb modified nutrition therapy discussed. Reviewed with patient steps to reading a food label with close attention to  serving size and mg of sodium.   *Discussed fluid intake with patient as well. Patient not currently on a fluid restriction, but advised no more than 64 ounces of fluid per day.  Demonstrated this volume to patient using the bedside water pitcher. ?  *Instructed patient to take medications as prescribed for heart failure. Explained briefly why patient is on the medications (either make you feel better, live longer or keep you out of the hospital) and discussed monitoring and side effects. Patient has financial barriers to obtaining medications. Patient takes less insulin when he can not afford his insulin and Lebatolol. This EP will consult with Case Manager about medication management and any other resources we can offer patient.  *Discussed exercise / activity. Patient is currently unable to be active due to shortness of breath and swelling. Patient wants to be active. Patient will have some home health. Patient is interested in Cardiac Rehab. Patient does not have a phone. This EP is going to try to figure out how to get patient into program before he is discharged from the hospital.  Encouraged patient to be as active as possible.  If HH PT or STR is a possibility with patient, then Cardiac or Pulmonary Rehab will be appropriate after STR and in-home PT.   *Smoking Cessation- Patient is a NEVER smoker.    *[facility] Failure [facility] - Explained the purpose of the HF Clinic. Explained to patient the HF Clinic does not replace PCP nor Cardiologist, but is an additional resource to helping patient manage heart failure at home. Patient's appointment in the [facility] is scheduled for 07/15 at 9:20 am.  Again, the 5 Steps to Living Better with Heart Failure were  reviewed with patient.  Patient thanked me for providing the above information. ?  Jasper Loser, [facility]  Exercise Physiologist Department Phone #: [phone] Fax: [phone]  Direct Line  [phone] Email Address: [email or web address]

## 2018-11-06 NOTE — Consult Note (Addendum)
[facility]  CARDIOLOGY CONSULT NOTE  Patient ID: Kevin Gibson MRN: [id number] DOB/AGE: [birth year] 78 y.o.  Admit date: 11/04/2018 Referring Physician Dr. Darvin Neighbours Primary Physician Dr. Ouida Sills Primary Cardiologist None Reason for Consultation CHF  HPI:  Kevin Gibson is a 78 y.o. with a past medical history of sick sinus syndrome s/p dual chamber pacemaker placement in 11/2013, HTN, diabetes mellitus, CKD, and obstructive sleep apnea, who presented to the ED on 06/15 for progressive shortness of breath, left arm swelling, and lower extremity swelling that begun ~1 week prior to his admission. He states that prior to one week ago he was in his usual state of health. He typically is able to walk without any dyspnea, however, in the day leading up to this admission he was unable to cross the room without becoming winded. He denies having recent chest pain, lightheadedness, or syncope.   Review of systems complete and found to be negative unless listed above   Past Medical History:  Diagnosis Date  . Chronic kidney disease   . Diabetes mellitus without complication ([place])   . Heart murmur   . Hyperlipidemia   . Hypertension   . Pacemaker   . RBBB   . Syncope and collapse   . Third degree heart block [facility])     Past Surgical History:  Procedure Laterality Date  . PACEMAKER INSERTION    . PERMANENT PACEMAKER INSERTION N/A 11/26/2013   Procedure: PERMANENT PACEMAKER INSERTION;  Surgeon: Deboraha Sprang, MD;  Location: [facility] CATH LAB;  Service: Cardiovascular;  Laterality: N/A;    Medications Prior to Admission  Medication Sig Dispense Refill Last Dose  . aspirin EC 81 MG tablet Take 81 mg by mouth every other day.   11/04/2018 at 0900  . labetalol (NORMODYNE) 200 MG tablet Take 2 tablets (400 mg total) by mouth 2 (two) times daily. (Patient taking differently: Take 200 mg by mouth 4 (four) times daily. ) 60 tablet 0 11/04/2018 at 0900  . amLODipine (NORVASC) 10 MG tablet TAKE ONE TABLET  BY MOUTH ONCE DAILY (Patient not taking: Reported on 11/04/2018) 30 tablet 3 Not Taking at Unknown time   Social History   Socioeconomic History  . Marital status: Married    Spouse name: Not on file  . Number of children: Not on file  . Years of education: Not on file  . Highest education level: Not on file  Occupational History  . Not on file  Social Needs  . Financial resource strain: Not on file  . Food insecurity    Worry: Not on file    Inability: Not on file  . Transportation needs    Medical: Not on file    Non-medical: Not on file  Tobacco Use  . Smoking status: Former Smoker    Years: 0.00    Types: Cigarettes    Quit date: 04/21/2000    Years since quitting: 18.5  . Smokeless tobacco: Never Used  Substance and Sexual Activity  . Alcohol use: No  . Drug use: No  . Sexual activity: Not on file  Lifestyle  . Physical activity    Days per week: Not on file    Minutes per session: Not on file  . Stress: Not on file  Relationships  . Social Herbalist on phone: Not on file    Gets together: Not on file    Attends religious service: Not on file    Active member of club or organization:  Not on file    Attends meetings of clubs or organizations: Not on file    Relationship status: Not on file  . Intimate partner violence    Fear of current or ex partner: Not on file    Emotionally abused: Not on file    Physically abused: Not on file    Forced sexual activity: Not on file  Other Topics Concern  . Not on file  Social History Narrative  . Not on file    Family History  Problem Relation Age of Onset  . Diabetes Mother    Review of systems complete and found to be negative unless listed above    PHYSICAL EXAM  General: Well developed, well nourished, in no acute distress HEENT:  Normocephalic and atramatic Lungs: Clear bilaterally to auscultation and percussion. Heart: HRRR . Normal S1 and S2 without gallops or murmurs.  Abdomen: Abdomen soft  and non-tender  Extremities: Pitting edema to bilateral lower extremities up to the mid-shins bilaterally. Also significant pitting edema to his left upper extremity.    Neuro: Alert and oriented X 3. Psych:  Good affect, responds appropriately  Labs:   Lab Results  Component Value Date   WBC 5.6 11/05/2018   HGB 11.6 (L) 11/05/2018   HCT 35.4 (L) 11/05/2018   MCV 91.9 11/05/2018   PLT 166 11/05/2018    Recent Labs  Lab 11/06/18 0544  NA 140  K 3.5  CL 106  CO2 26  BUN 43*  CREATININE 2.35*  CALCIUM 8.3*  GLUCOSE 82   Lab Results  Component Value Date   TROPONINI 0.05 (HH) 11/04/2018    Lab Results  Component Value Date   CHOL 158 10/09/2013   Lab Results  Component Value Date   HDL 36 (L) 10/09/2013   Lab Results  Component Value Date   LDLCALC 98 10/09/2013   Lab Results  Component Value Date   TRIG 122 10/09/2013   No results found for: CHOLHDL No results found for: LDLDIRECT    Radiology: US Venous Img Upper Uni Left  Result Date: 11/04/2018 CLINICAL DATA:  Left upper extremity swelling for the past 3 days. Evaluate for DVT. EXAM: LEFT UPPER EXTREMITY VENOUS DOPPLER ULTRASOUND TECHNIQUE: Gray-scale sonography with graded compression, as well as color Doppler and duplex ultrasound were performed to evaluate the upper extremity deep venous system from the level of the subclavian vein and including the jugular, axillary, basilic, radial, ulnar and upper cephalic vein. Spectral Doppler was utilized to evaluate flow at rest and with distal augmentation maneuvers. COMPARISON:  None. FINDINGS: Contralateral Subclavian Vein: Respiratory phasicity is normal and symmetric with the symptomatic side. No evidence of thrombus. Normal compressibility. Internal Jugular Vein: No evidence of thrombus. Normal compressibility, respiratory phasicity and response to augmentation. Subclavian Vein: No evidence of thrombus. Normal compressibility, respiratory phasicity and response to  augmentation. Axillary Vein: No evidence of thrombus. Normal compressibility, respiratory phasicity and response to augmentation. Cephalic Vein: No evidence of thrombus. Normal compressibility, respiratory phasicity and response to augmentation. Basilic Vein: There is mixed echogenic occlusive thrombus seen throughout the interrogated course of the slightly atrophic left basilic vein (images 22 through 26). Brachial Veins: No evidence of thrombus. Normal compressibility, respiratory phasicity and response to augmentation. Radial Veins: No evidence of thrombus. Normal compressibility, respiratory phasicity and response to augmentation. Ulnar Veins: No evidence of thrombus. Normal compressibility, respiratory phasicity and response to augmentation. Venous Reflux:  None visualized. Other Findings: Moderate amount of subcutaneous edema is noted throughout  the left upper extremity. IMPRESSION: 1. No evidence of DVT within the left lower extremity. 2. 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 Again, there is no extension of this age-indeterminate SVT to the deep venous system of the left upper extremity. Electronically Signed   By: Sandi Mariscal M.D.   On: 11/04/2018 14:54   Dg Chest Port 1 View  Result Date: 11/04/2018 CLINICAL DATA:  Shortness of breath, chest pain EXAM: PORTABLE CHEST 1 VIEW COMPARISON:  11/26/2013 FINDINGS: Small bilateral pleural effusions. Mild bilateral interstitial thickening. No pneumothorax. Stable cardiomediastinal silhouette. Dual lead cardiac pacemaker. No aggressive osseous lesion. IMPRESSION: Mild pulmonary edema. Electronically Signed   By: Kathreen Devoid   On: 11/04/2018 12:40    EKG:  Ventricular paced rhythm, Rate of 92 BPM, left bundle branch block pattern   ASSESSMENT AND PLAN:  Mr. Brosky is a 78 year old male with hx of sick sinus syndrome s/p dual chamber pacer placement in  2015, HTN, diabetes mellitus, and obstructive sleep apnea (not using CPAP), who presented to the ED with shortness of breath, pedal edema, and evidence of new onset heart failure. Has received diuresis with minimal improvement of symptoms.   New onset systolic heart failure:  CXR with bilateral pleural effusions, BNP significantly elevated to 4500, and patient with overt signs of fluid overload on exam. ECHO completed yesterday showed significantly reduced LV function with EF of 20-25%. Minimal troponin elevation likely secondary to demand ischemia. Lower suspicion for ACS. There is no evidence of anemia, or current infection contributing. Will plan to stabilize and manage his heart failure symptoms with diuresis. Plan to evaluate for underlying etiology of new onset heart failure by checking TSH and obtaining a stress test.  - Continue diuresis but consider increase om IV lasix dose to 80 mg BID given significant edema still on exam  - Obtain lexiscan stress test to evaluate for possible ischemic etiology of his heart failure  - Maximize heart failure management by adding ACE/ARB when appropriate for further management of heart failure  - Long term treatment options include work up of sleep apnea in the outpatient setting, possible upgrade from pacer to bi-ventricular pacing device, and optimization of his medication regimen with switch to alternative beta blocker at a later date  Superficial vein thrombosis:  Based on ultrasound report, there was no evidence of DVT to his left upper extremity. 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 Appropriate to continue eliquis for management, however, plan to evaluate this further and make further determination about long term anticoagulation in the outpatient setting.   Troponin elevation:  Minimal troponin elevation likely secondary to demand ischemia. Lower suspicion for ACS. - Stress test ordered as per above to evaluate for  ischemic etiology of his new onset heart failure, however, no further intervention or diagnostics indicated at this time.   The patient's history and exam findings were discussed with Dr. Nehemiah Massed. The plan was made in conjunction with Dr. Nehemiah Massed.  Signed: Hilbert Odor PA-C 11/06/2018, 10:02 AM  Patient examined and interviewed and agree with above  Serafina Royals MD [facility]

## 2018-11-06 NOTE — Progress Notes (Addendum)
Inpatient Diabetes Program Recommendations  AACE/ADA: New Consensus Statement on Inpatient Glycemic Control (2015)  Target Ranges:  Prepandial:   less than 140 mg/dL      Peak postprandial:   less than 180 mg/dL (1-2 hours)      Critically ill patients:  140 - 180 mg/dL   Lab Results  Component Value Date   GLUCAP 135 (H) 11/06/2018   HGBA1C 12.1 (H) 11/04/2018    Review of Glycemic Control Results for Kevin Gibson, Kevin Gibson (MRN [id number]) as of 11/06/2018 13:30  Ref. Range 11/04/2018 16:29 11/04/2018 18:11 11/04/2018 20:27 11/05/2018 07:19 11/05/2018 08:09 11/05/2018 11:28 11/05/2018 16:08 11/05/2018 20:14 11/06/2018 07:42 11/06/2018 11:23  Glucose-Capillary Latest Ref Range: 70 - 99 mg/dL 383 (H) 363 (H) 270 (H) 61 (L) 81 80 103 (H) 144 (H) 70 135 (H)   Diabetes history: DM 2 Outpatient Diabetes medications: None (stopped taking insulin due to cost) Current orders for Inpatient glycemic control:  Novolog moderate tid with meals and HS Inpatient Diabetes Program Recommendations:   Spoke with patient on 6/16 regarding restart of insulin due to high A1C.  However after receiving Lantus 18 units and Novolog 25 units on 6/15, patients blood sugars have been <100 mg/dL.  CBG's 70 and 135 mg/dL today, with no basal insulin given on 6/16.  Based on A1C he likely needs insulin, however he appears to be sensitive to insulin administration and doses? Need to avoid hypoglycemia. Will need close f/u with PCP.  Thanks,  Adah Perl, RN, BC-ADM Inpatient Diabetes Coordinator Pager [phone]   Addendum:  Spoke with patient again.  He states that he thinks his blood sugars are better with the insulin.  He is agreeable to taking insulin but states that the cost was a barrier for him.  He states that he often had to pick between his insulin or BP medication due to cost.  He also states that he skips meals but makes sure that his 28 year old son (mentally disabled) can eat.  He states that his house is in poor  repair and he is getting daughter to see if he qualifies for low income housing for he and his son. Explained what a low blood sugar is and how to treat.  He was able to teach back stating "I will drink juice if it is low".  Asked if he would be interested in home health however he states that he does not want people at his house bc he is not able to clean it and it is unorganized.  He states that he drinks a lot of sweet tea.  Encouraged him to drink un-sweet tea instead, and he states "I can do that".  Will follow.

## 2018-11-06 NOTE — TOC Progression Note (Signed)
Transition of Care [facility]) - Progression Note    Patient Details  Name: Kevin Gibson MRN: [id number] Date of Birth: Jun 12, 1940  Transition of Care [facility]) CM/SW Contact  Beverly Sessions, RN Phone Number: 11/06/2018, 3:51 PM  Clinical Narrative:     PT has assessed patient and recommends home health PT.  RNCM to follow up with patient on 6/18 to discuss   Expected Discharge Plan: Home/Self Care Barriers to Discharge: Continued Medical Work up  Expected Discharge Plan and Services Expected Discharge Plan: Home/Self Care In-house Referral: Clinical Social Work Discharge Planning Services: Other - See comment(Scale provided for patient)   Living arrangements for the past 2 months: Single Family Home Expected Discharge Date: 11/06/18               DME Arranged: N/A DME Agency: NA       HH Arranged: NA HH Agency: NA         Social Determinants of Health (SDOH) Interventions    Readmission Risk Interventions No flowsheet data found.

## 2018-11-07 ENCOUNTER — Inpatient Hospital Stay: Payer: Medicare HMO

## 2018-11-07 LAB — CBC
HCT: 30.6 % — ABNORMAL LOW (ref 39.0–52.0)
Hemoglobin: 10 g/dL — ABNORMAL LOW (ref 13.0–17.0)
MCH: 30.3 pg (ref 26.0–34.0)
MCHC: 32.7 g/dL (ref 30.0–36.0)
MCV: 92.7 fL (ref 80.0–100.0)
Platelets: 146 10*3/uL — ABNORMAL LOW (ref 150–400)
RBC: 3.3 MIL/uL — ABNORMAL LOW (ref 4.22–5.81)
RDW: 14.4 % (ref 11.5–15.5)
WBC: 4.9 10*3/uL (ref 4.0–10.5)
nRBC: 0 % (ref 0.0–0.2)

## 2018-11-07 LAB — BASIC METABOLIC PANEL
Anion gap: 11 (ref 5–15)
BUN: 46 mg/dL — ABNORMAL HIGH (ref 8–23)
CO2: 25 mmol/L (ref 22–32)
Calcium: 8.3 mg/dL — ABNORMAL LOW (ref 8.9–10.3)
Chloride: 104 mmol/L (ref 98–111)
Creatinine, Ser: 2.49 mg/dL — ABNORMAL HIGH (ref 0.61–1.24)
GFR calc Af Amer: 28 mL/min — ABNORMAL LOW (ref >60)
GFR calc non Af Amer: 24 mL/min — ABNORMAL LOW (ref >60)
Glucose, Bld: 149 mg/dL — ABNORMAL HIGH (ref 70–99)
Potassium: 3.6 mmol/L (ref 3.5–5.1)
Sodium: 140 mmol/L (ref 135–145)

## 2018-11-07 LAB — NM MYOCAR MULTI W/SPECT W/WALL MOTION / EF
Estimated workload: 1 METS
Exercise duration (min): 1 min
Exercise duration (sec): 9 s
LV dias vol: 137 mL (ref 62–150)
LV sys vol: 86 mL
MPHR: 144 {beats}/min
Peak HR: 63 {beats}/min
Percent HR: 43 %
Rest HR: 68 {beats}/min
SDS: 1
SRS: 13
SSS: 15
TID: 1.03

## 2018-11-07 LAB — URINALYSIS, ROUTINE W REFLEX MICROSCOPIC
Bacteria, UA: NONE SEEN
Bilirubin Urine: NEGATIVE
Glucose, UA: 50 mg/dL — AB
Hgb urine dipstick: NEGATIVE
Ketones, ur: NEGATIVE mg/dL
Leukocytes,Ua: NEGATIVE
Nitrite: NEGATIVE
Protein, ur: 100 mg/dL — AB
Specific Gravity, Urine: 1.009 (ref 1.005–1.030)
pH: 5 (ref 5.0–8.0)

## 2018-11-07 LAB — TSH: TSH: 3.041 u[IU]/mL (ref 0.350–4.500)

## 2018-11-07 LAB — GLUCOSE, CAPILLARY
Glucose-Capillary: 123 mg/dL — ABNORMAL HIGH (ref 70–99)
Glucose-Capillary: 112 mg/dL — ABNORMAL HIGH (ref 70–99)
Glucose-Capillary: 199 mg/dL — ABNORMAL HIGH (ref 70–99)
Glucose-Capillary: 300 mg/dL — ABNORMAL HIGH (ref 70–99)

## 2018-11-07 LAB — PROTEIN / CREATININE RATIO, URINE
Creatinine, Urine: 68 mg/dL
Protein Creatinine Ratio: 1.5 mg/mg{Cre} — ABNORMAL HIGH (ref 0.00–0.15)
Total Protein, Urine: 102 mg/dL

## 2018-11-07 MED ORDER — FUROSEMIDE 10 MG/ML IJ SOLN
40.0000 mg | Freq: Two times a day (BID) | INTRAMUSCULAR | Status: DC
  Administered 2018-11-07 – 2018-11-09 (×4): 40 mg via INTRAVENOUS
  Filled 2018-11-07 (×4): qty 4

## 2018-11-07 MED ORDER — REGADENOSON 0.4 MG/5ML IV SOLN
0.4000 mg | Freq: Once | INTRAVENOUS | Status: AC
  Administered 2018-11-07: 0.4 mg via INTRAVENOUS

## 2018-11-07 MED ORDER — TECHNETIUM TC 99M TETROFOSMIN IV KIT
10.5600 | PACK | Freq: Once | INTRAVENOUS | Status: AC | PRN
  Administered 2018-11-07: 10.56 via INTRAVENOUS

## 2018-11-07 MED ORDER — TECHNETIUM TC 99M TETROFOSMIN IV KIT
30.8600 | PACK | Freq: Once | INTRAVENOUS | Status: AC | PRN
  Administered 2018-11-07: 30.86 via INTRAVENOUS

## 2018-11-07 MED ORDER — FUROSEMIDE 10 MG/ML IJ SOLN: 60.0000 mg | Freq: Two times a day (BID) | INTRAMUSCULAR | Status: DC

## 2018-11-07 NOTE — Progress Notes (Addendum)
[facility] Cardiology  SUBJECTIVE:  GEORDIE NOONEY is a 78 y.o. with a past medical history of sick sinus syndrome s/p dual chamber pacemaker placement in 11/2013, HTN, diabetes mellitus, CKD, and obstructive sleep apnea, who presented to the ED on 06/15 for progressive shortness of breath, left arm swelling, and lower extremity swelling that begun ~1 week prior to his admission - now found to be in new onset heart failure with significantly reduced EF of 20-25%. On reassessment today, he reports feeling well. His breathing and swelling have improved, although he still endorses significant lower extremity pitting edema. He denies any chest pain, lightheadedness, syncope, or palpitations.   Vitals:   11/06/18 1916 11/07/18 0318 11/07/18 0319 11/07/18 0713  BP:   117/67 107/61  Pulse: 61  61 (!) 59  Resp: 20  20 19   Temp: 98.3 F (36.8 C)  97.7 F (36.5 C) 98.4 F (36.9 C)  TempSrc: Oral  Oral Oral  SpO2: 96%  99% 97%  Weight:  73.8 kg    Height:         Intake/Output Summary (Last 24 hours) at 11/07/2018 0834 Last data filed at 11/07/2018 0214 Gross per 24 hour  Intake 720 ml  Output 1825 ml  Net -1105 ml    PHYSICAL EXAM  General: Well developed, well nourished, in no acute distress HEENT:  Normocephalic and atramatic Lungs: Clear bilaterally to auscultation and percussion. Heart: HRRR . Normal S1 and S2 without gallops or murmurs.  Abdomen: Abdomen soft and non-tender  Extremities: Pitting edema to bilateral lower extremities up to the mid-shins bilaterally. Also significant pitting edema to his left upper extremity.  Neuro: Alert and oriented X 3. Psych:  Good affect, responds appropriately   LABS: Basic Metabolic Panel: Recent Labs    11/06/18 0544 11/07/18 0437  NA 140 140  K 3.5 3.6  CL 106 104  CO2 26 25  GLUCOSE 82 149*  BUN 43* 46*  CREATININE 2.35* 2.49*  CALCIUM 8.3* 8.3*   Liver Function Tests: No results for input(s): AST, ALT, ALKPHOS, BILITOT, PROT,  ALBUMIN in the last 72 hours. No results for input(s): LIPASE, AMYLASE in the last 72 hours. CBC: Recent Labs    11/05/18 0411 11/07/18 0437  WBC 5.6 4.9  HGB 11.6* 10.0*  HCT 35.4* 30.6*  MCV 91.9 92.7  PLT 166 146*   Cardiac Enzymes: Recent Labs    11/04/18 1148  TROPONINI 0.05*   BNP: Invalid input(s): POCBNP D-Dimer: No results for input(s): DDIMER in the last 72 hours. Hemoglobin A1C: Recent Labs    11/04/18 1749  HGBA1C 12.1*   Fasting Lipid Panel: No results for input(s): CHOL, HDL, LDLCALC, TRIG, CHOLHDL, LDLDIRECT in the last 72 hours. Thyroid Function Tests: Recent Labs    11/07/18 0437  TSH 3.041   Anemia Panel: No results for input(s): VITAMINB12, FOLATE, FERRITIN, TIBC, IRON, RETICCTPCT in the last 72 hours.  No results found.   ASSESSMENT AND PLAN: Mr. Pawloski is a 78 year old male with hx of sick sinus syndrome s/p dual chamber pacer placement in 2015, HTN, diabetes mellitus, and obstructive sleep apnea (not using CPAP), who presented to the ED with shortness of breath, pedal edema, and evidence of new onset heart failure. Has received diuresis with minimal improvement of symptoms.   Active Problems:   CHF (congestive heart failure) ([place])    New onset systolic heart failure:  CXR with bilateral pleural effusions, BNP significantly elevated to 4500, and patient with overt signs  of fluid overload on exam. ECHO completed yesterday showed significantly reduced LV function with EF of 20-25%. Minimal troponin elevation likely secondary to demand ischemia. Lower suspicion for ACS. There is no evidence of anemia, or current infection contributing. Will plan to stabilize and manage his heart failure symptoms with diuresis.He denies any alcohol use - so alcohol-induced cardiomyopathy less likely. TSH completed today was within normal limits, ruling out thyroid cause of his heart failure.  - Continue diuresis with IV lasix 80 mg BID given significant edema  still on exam  - Lexiscan stress test pending  - Maximize heart failure management by adding ACE/ARB when appropriate for further management of heart failure  - Long term treatment options include work up of sleep apnea in the outpatient setting, possible upgrade from pacer to bi-ventricular pacing device, and optimization of his medication regimen with switch to alternative beta blocker at a later date  Superficial vein thrombosis:  Based on ultrasound report, there was no evidence of DVT to his left upper extremity. 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 Appropriate to continue eliquis for management, however, plan to evaluate this further and make further determination about long term anticoagulation in the outpatient setting.   Troponin elevation:  Minimal troponin elevation likely secondary to demand ischemia. Lower suspicion for ACS. - Stress test ordered as per above to evaluate for ischemic etiology of his new onset heart failure, however, no further intervention or diagnostics indicated at this time.     Hilbert Odor, PA-C 11/07/2018 8:34 AM  Patient interviewed and examined and agree with above  Serafina Royals MD [facility]

## 2018-11-07 NOTE — Care Management Important Message (Signed)
Important Message  Patient Details  Name: Kevin Gibson MRN: [id number] Date of Birth: Dec 12, 1940   Medicare Important Message Given:  Yes    Dannette Barbara 11/07/2018, 12:44 PM

## 2018-11-07 NOTE — Progress Notes (Signed)
PT Cancellation Note  Patient Details Name: CLEMENS LACHMAN MRN: [id number] DOB: 1940-06-14   Cancelled Treatment:    Reason Eval/Treat Not Completed: Other (comment)  Offered and encouraged session.  Pt stated he feels good with mobility with staff on unit but primary limitation is SOB.  Stated he was recently up and did not want to try again at this time.  Will continue as appropriate.  Chesley Noon 11/07/2018, 4:19 PM

## 2018-11-07 NOTE — Progress Notes (Addendum)
Inpatient Diabetes Program Recommendations  AACE/ADA: New Consensus Statement on Inpatient Glycemic Control (2015)  Target Ranges:  Prepandial:   less than 140 mg/dL      Peak postprandial:   less than 180 mg/dL (1-2 hours)      Critically ill patients:  140 - 180 mg/dL   Results for Kevin Gibson, Kevin Gibson (MRN [id number]) as of 11/07/2018 11:52  Ref. Range 11/06/2018 07:42 11/06/2018 11:23 11/06/2018 16:44 11/06/2018 20:45  Glucose-Capillary Latest Ref Range: 70 - 99 mg/dL 70  3 units NOVOLOG  135 (H)  1 unit NOVOLOG  172 (H)  3 units NOVOLOG  218 (H)  2 units NOVOLOG    Results for Kevin Gibson, Kevin Gibson (MRN [id number]) as of 11/07/2018 11:52  Ref. Range 11/07/2018 07:14  Glucose-Capillary Latest Ref Range: 70 - 99 mg/dL 123 (H)     Home DM Meds: None (stopped taking insulin due to cost)  Current Orders: Novolog Moderate Correction Scale/ SSI (0-15 units) TID AC + HS     DM Coordinator spoke with patient on 6/16 and 6/17 regarding restart of insulin due to high A1C.    Based on A1C of 12.1%, he likely needs insulin, however he appears to be sensitive to insulin administration and doses? Need to avoid hypoglycemia at home.  Will need close f/u with PCP after discharge.    MD- Note patient has not gotten any basal insulin since 6pm on 06/15.  Only getting Novolog SSI.  Not requiring much Novolog SSI in hospital.  Eating 80-100% of meals.  Note that Lantus was discontinued--Agree.  Perhaps patient could just use a sliding scale regimen at home TID with meals??   He can get Regular insulin at [facility] for $25.  Has taken insulin in the past and is familiar with insulin injection technique.      --Will follow patient during hospitalization--  Wyn Quaker RN, MSN, CDE Diabetes Coordinator Inpatient Glycemic Control Team Team Pager: [phone] (8a-5p)

## 2018-11-07 NOTE — TOC Progression Note (Signed)
Transition of Care [facility]) - Progression Note    Patient Details  Name: Kevin Gibson MRN: [id number] Date of Birth: 23-Dec-1940  [facility] CM/SW Contact  Beverly Sessions, RN Phone Number: 11/07/2018, 1:24 PM  Clinical Narrative:    Attempted to follow up with patient.  Patient is currently off the floor.  I have printed information for Meals on Wheels to provide. Also need to follow up on recommendation of home health.    Will follow up at a later time    Expected Discharge Plan: Home/Self Care Barriers to Discharge: Continued Medical Work up  Expected Discharge Plan and Services Expected Discharge Plan: Home/Self Care In-house Referral: Clinical Social Work Discharge Planning Services: Other - See comment(Scale provided for patient)   Living arrangements for the past 2 months: Single Family Home Expected Discharge Date: 11/06/18               DME Arranged: N/A DME Agency: NA       HH Arranged: NA HH Agency: NA         Social Determinants of Health (SDOH) Interventions    Readmission Risk Interventions No flowsheet data found.

## 2018-11-07 NOTE — Progress Notes (Signed)
Kevin Gibson NAME: Kevin Gibson    MR#:  [id number]  DATE OF BIRTH:  June 25, 1940  SUBJECTIVE:  CHIEF COMPLAINT: Patient shortness of breath is  better , denies any chest pain reports swelling in his extremities is improving.  Awaiting for stress test today  REVIEW OF SYSTEMS:  CONSTITUTIONAL: No fever, fatigue or weakness.  EYES: No blurred or double vision.  EARS, NOSE, AND THROAT: No tinnitus or ear pain.  RESPIRATORY: No cough, shortness of breath, wheezing or hemoptysis.  CARDIOVASCULAR: No chest pain, orthopnea, edema.  GASTROINTESTINAL: No nausea, vomiting, diarrhea or abdominal pain.  GENITOURINARY: No dysuria, hematuria.  ENDOCRINE: No polyuria, nocturia,  HEMATOLOGY: No anemia, easy bruising or bleeding SKIN: No rash or lesion. MUSCULOSKELETAL: No joint pain or arthritis.   NEUROLOGIC: No tingling, numbness, weakness.  PSYCHIATRY: No anxiety or depression.   DRUG ALLERGIES:  No Known Allergies  VITALS:  Blood pressure 123/66, pulse 64, temperature (!) 97.5 F (36.4 C), temperature source Oral, resp. rate 19, height 5\' 7"  (1.702 m), weight 73.8 kg, SpO2 99 %.  PHYSICAL EXAMINATION:  GENERAL:  78 y.o.-year-old patient lying in the bed with no acute distress.  EYES: Pupils equal, round, reactive to light and accommodation. No scleral icterus. Extraocular muscles intact.  HEENT: Head atraumatic, normocephalic. Oropharynx and nasopharynx clear.  NECK:  Supple, no jugular venous distention. No thyroid enlargement, no tenderness.  LUNGS: Normal breath sounds bilaterally, no wheezing, rales,rhonchi or crepitation. No use of accessory muscles of respiration.  CARDIOVASCULAR: S1, S2 normal. No murmurs, rubs, or gallops.  ABDOMEN: Soft, nontender, nondistended. Bowel sounds present. EXTREMITIES: Significant pedal edema and left upper extremity pitting edema, 2+, no cyanosis, or clubbing.  NEUROLOGIC: Cranial nerves II  through XII are intact. Muscle strength 5/5 in all extremities. Sensation intact. Gait not checked.  PSYCHIATRIC: The patient is alert and oriented x 3.  SKIN: No obvious rash, lesion, or ulcer.    LABORATORY PANEL:   CBC Recent Labs  Lab 11/07/18 0437  WBC 4.9  HGB 10.0*  HCT 30.6*  PLT 146*   ------------------------------------------------------------------------------------------------------------------  Chemistries  Recent Labs  Lab 11/07/18 0437  NA 140  K 3.6  CL 104  CO2 25  GLUCOSE 149*  BUN 46*  CREATININE 2.49*  CALCIUM 8.3*   ------------------------------------------------------------------------------------------------------------------  Cardiac Enzymes Recent Labs  Lab 11/04/18 1148  TROPONINI 0.05*   ------------------------------------------------------------------------------------------------------------------  RADIOLOGY:  Nm Myocar Multi W/spect W/wall Motion / Ef  Result Date: 11/07/2018  Blood pressure demonstrated a normal response to exercise.  The left ventricular ejection fraction is severely decreased (<30%).  This is a low risk study.  The study is normal.     EKG:   Orders placed or performed during the hospital encounter of 11/04/18  . EKG 12-Lead  . EKG 12-Lead  . ED EKG  . ED EKG    ASSESSMENT AND PLAN:   *Acute on chronic systolic congestive heart failure.  Last echocardiogram was in 2015. Clinically feeling better  IV Lasix dose decreased to 40 mg twice a day as renal function is getting worse   Myoview stress test done today -normal Patient is on labetalol at home that have continued. Monitor daily weights intake and output Teds Keep legs elevated Repeat echocardiogram during this admission with a 20 to 25% ejection fraction.  Elevated mean left atrial pressure with left ventricular diffuse hypokinesis Need to consider small dose ACE inhibitor once renal function is better Appreciate  Dr. Montine Circle  recommendations  *AKI on CKD stage III.  Baseline creatinine at 2 Baseline creatinine 2.1creatinine at 2.35-2.49   Monitor while being diuresed.   Repeat labs in the morning Nephrology consulted, Dr. Silverio Lay is aware of the consult   *Diabetes mellitus.  Insulin-dependent. -CBGs were soft Patient has stopped taking insulin as he was unable to afford for the past few months.   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  But symptoms started 5 days prior to admission ,due to his symptoms and significant swelling patient is started on Eliquis.      All the records are reviewed and case discussed with Care Management/Social Workerr. Management plans discussed with the patient, he is  in agreement.  CODE STATUS: fc  TOTAL TIME TAKING CARE OF THIS PATIENT: 39  minutes.   POSSIBLE D/C IN 2-3 DAYS, DEPENDING ON CLINICAL CONDITION.  Note: This dictation was prepared with Dragon dictation along with smaller phrase technology. Any transcriptional errors that result from this process are unintentional.   Nicholes Mango M.D on 11/07/2018 at 5:45 PM  Between 7am to 6pm - Pager - [phone] After 6pm go to [email or web address] - password [email or web address] [facility]  [facility]  Office  [phone]  CC: Primary care physician; Kirk Ruths, MD

## 2018-11-08 ENCOUNTER — Inpatient Hospital Stay: Payer: Medicare HMO

## 2018-11-08 LAB — CBC
HCT: 29.4 % — ABNORMAL LOW (ref 39.0–52.0)
Hemoglobin: 9.6 g/dL — ABNORMAL LOW (ref 13.0–17.0)
MCH: 30.2 pg (ref 26.0–34.0)
MCHC: 32.7 g/dL (ref 30.0–36.0)
MCV: 92.5 fL (ref 80.0–100.0)
Platelets: 141 10*3/uL — ABNORMAL LOW (ref 150–400)
RBC: 3.18 MIL/uL — ABNORMAL LOW (ref 4.22–5.81)
RDW: 14.4 % (ref 11.5–15.5)
WBC: 5.5 10*3/uL (ref 4.0–10.5)
nRBC: 0 % (ref 0.0–0.2)

## 2018-11-08 LAB — HEPATIC FUNCTION PANEL
ALT: 17 U/L (ref 0–44)
AST: 20 U/L (ref 15–41)
Albumin: 2.2 g/dL — ABNORMAL LOW (ref 3.5–5.0)
Alkaline Phosphatase: 65 U/L (ref 38–126)
Bilirubin, Direct: 0.2 mg/dL (ref 0.0–0.2)
Indirect Bilirubin: 0.7 mg/dL (ref 0.3–0.9)
Total Bilirubin: 0.9 mg/dL (ref 0.3–1.2)
Total Protein: 4.7 g/dL — ABNORMAL LOW (ref 6.5–8.1)

## 2018-11-08 LAB — BASIC METABOLIC PANEL
Anion gap: 7 (ref 5–15)
BUN: 48 mg/dL — ABNORMAL HIGH (ref 8–23)
CO2: 28 mmol/L (ref 22–32)
Calcium: 8.3 mg/dL — ABNORMAL LOW (ref 8.9–10.3)
Chloride: 102 mmol/L (ref 98–111)
Creatinine, Ser: 2.62 mg/dL — ABNORMAL HIGH (ref 0.61–1.24)
GFR calc Af Amer: 26 mL/min — ABNORMAL LOW (ref >60)
GFR calc non Af Amer: 23 mL/min — ABNORMAL LOW (ref >60)
Glucose, Bld: 224 mg/dL — ABNORMAL HIGH (ref 70–99)
Potassium: 3.9 mmol/L (ref 3.5–5.1)
Sodium: 137 mmol/L (ref 135–145)

## 2018-11-08 LAB — GLUCOSE, CAPILLARY
Glucose-Capillary: 208 mg/dL — ABNORMAL HIGH (ref 70–99)
Glucose-Capillary: 325 mg/dL — ABNORMAL HIGH (ref 70–99)
Glucose-Capillary: 270 mg/dL — ABNORMAL HIGH (ref 70–99)
Glucose-Capillary: 150 mg/dL — ABNORMAL HIGH (ref 70–99)

## 2018-11-08 MED ORDER — ALBUMIN HUMAN 25 % IV SOLN
25.0000 g | Freq: Three times a day (TID) | INTRAVENOUS | Status: DC
  Filled 2018-11-08 (×2): qty 100

## 2018-11-08 MED ORDER — ALBUMIN HUMAN 25 % IV SOLN
25.0000 g | Freq: Three times a day (TID) | INTRAVENOUS | Status: AC
  Administered 2018-11-09 – 2018-11-12 (×9): 25 g via INTRAVENOUS
  Filled 2018-11-08 (×9): qty 100

## 2018-11-08 MED ORDER — INSULIN GLARGINE 100 UNIT/ML ~~LOC~~ SOLN
8.0000 [IU] | Freq: Every day | SUBCUTANEOUS | Status: DC
  Administered 2018-11-08 – 2018-11-10 (×3): 8 [IU] via SUBCUTANEOUS
  Filled 2018-11-08 (×4): qty 0.08

## 2018-11-08 MED ORDER — ALBUMIN HUMAN 25 % IV SOLN
25.0000 g | Freq: Four times a day (QID) | INTRAVENOUS | Status: DC
  Filled 2018-11-08 (×2): qty 100

## 2018-11-08 NOTE — Consult Note (Signed)
[facility]  CONSULT NOTE    Date: 11/08/2018                  Patient Name:  Kevin Gibson  MRN: [id number]  DOB: May 29, 1940  Age / Sex: 78 y.o., male         PCP: Kirk Ruths, MD                 Service Requesting Consult: Dr. Posey Pronto                 Reason for Consult: Acute renal failure            History of Present Illness: Kevin Gibson was admitted on 11/04/2018 for worsening shortness of breath, chest pain and peripheral edema. Diagnosed on this admission with systolic congestive heart failure. Not responding well to furosemide. Ischemic work up negative.  Nephrology consulted.    Medications: Outpatient medications: Medications Prior to Admission  Medication Sig Dispense Refill Last Dose  . aspirin EC 81 MG tablet Take 81 mg by mouth every other day.   11/04/2018 at 0900  . labetalol (NORMODYNE) 200 MG tablet Take 2 tablets (400 mg total) by mouth 2 (two) times daily. (Patient taking differently: Take 200 mg by mouth 4 (four) times daily. ) 60 tablet 0 11/04/2018 at 0900  . amLODipine (NORVASC) 10 MG tablet TAKE ONE TABLET BY MOUTH ONCE DAILY (Patient not taking: Reported on 11/04/2018) 30 tablet 3 Not Taking at Unknown time    Current medications: Current Facility-Administered Medications  Medication Dose Route Frequency Provider Last Rate Last Dose  . acetaminophen (TYLENOL) tablet 650 mg  650 mg Oral Q6H PRN Hillary Bow, MD       Or  . acetaminophen (TYLENOL) suppository 650 mg  650 mg Rectal Q6H PRN Sudini, Alveta Heimlich, MD      . albumin human 25 % solution 25 g  25 g Intravenous Q6H Dustin Flock, MD      . Derrill Memo ON 11/09/2018] albumin human 25 % solution 25 g  25 g Intravenous Q8H Patel, Chana Bode, MD      . albuterol (PROVENTIL) (2.5 MG/3ML) 0.083% nebulizer solution 2.5 mg  2.5 mg Nebulization Q2H PRN Sudini, Srikar, MD      . amLODipine (NORVASC) tablet 10 mg  10 mg Oral Daily Hillary Bow, MD   10 mg at 11/08/18 1025  .  apixaban (ELIQUIS) tablet 10 mg  10 mg Oral BID Lu Duffel, RPH   10 mg at 11/08/18 1025   Followed by  . [START ON 11/11/2018] apixaban (ELIQUIS) tablet 5 mg  5 mg Oral BID Lu Duffel, RPH      . aspirin EC tablet 81 mg  81 mg Oral Geralynn Rile, MD   81 mg at 11/08/18 1025  . furosemide (LASIX) injection 40 mg  40 mg Intravenous BID Gouru, Aruna, MD   40 mg at 11/08/18 1024  . hydrALAZINE (APRESOLINE) injection 10 mg  10 mg Intravenous Q6H PRN Hillary Bow, MD   10 mg at 11/04/18 1818  . insulin aspart (novoLOG) injection 0-15 Units  0-15 Units Subcutaneous TID WC Hillary Bow, MD   11 Units at 11/08/18 1143  . insulin aspart (novoLOG) injection 0-5 Units  0-5 Units Subcutaneous QHS Hillary Bow, MD   3 Units at 11/07/18 2219  . insulin starter kit- syringes (English) 1 kit  1 kit Other Once Nicholes Mango, MD      .  labetalol (NORMODYNE) tablet 400 mg  400 mg Oral BID Hillary Bow, MD   400 mg at 11/08/18 1024  . living well with diabetes book MISC   Does not apply Once Gouru, Aruna, MD      . ondansetron (ZOFRAN) tablet 4 mg  4 mg Oral Q6H PRN Hillary Bow, MD       Or  . ondansetron (ZOFRAN) injection 4 mg  4 mg Intravenous Q6H PRN Sudini, Srikar, MD      . polyethylene glycol (MIRALAX / GLYCOLAX) packet 17 g  17 g Oral Daily PRN Sudini, Srikar, MD      . sodium chloride flush (NS) 0.9 % injection 3 mL  3 mL Intravenous Once Nena Polio, MD      . sodium chloride flush (NS) 0.9 % injection 3 mL  3 mL Intravenous Q12H Hillary Bow, MD   3 mL at 11/08/18 1025      Allergies: No Known Allergies    Past Medical History: Past Medical History:  Diagnosis Date  . Chronic kidney disease   . Diabetes mellitus without complication ([place])   . Heart murmur   . Hyperlipidemia   . Hypertension   . Pacemaker   . RBBB   . Syncope and collapse   . Third degree heart block [facility])      Past Surgical History: Past Surgical History:  Procedure Laterality  Date  . PACEMAKER INSERTION    . PERMANENT PACEMAKER INSERTION N/A 11/26/2013   Procedure: PERMANENT PACEMAKER INSERTION;  Surgeon: Deboraha Sprang, MD;  Location: [facility] CATH LAB;  Service: Cardiovascular;  Laterality: N/A;     Family History: Family History  Problem Relation Age of Onset  . Diabetes Mother      Social History: Social History   Socioeconomic History  . Marital status: Married    Spouse name: Not on file  . Number of children: Not on file  . Years of education: Not on file  . Highest education level: Not on file  Occupational History  . Not on file  Social Needs  . Financial resource strain: Not on file  . Food insecurity    Worry: Not on file    Inability: Not on file  . Transportation needs    Medical: Not on file    Non-medical: Not on file  Tobacco Use  . Smoking status: Former Smoker    Years: 0.00    Types: Cigarettes    Quit date: 04/21/2000    Years since quitting: 18.5  . Smokeless tobacco: Never Used  Substance and Sexual Activity  . Alcohol use: No  . Drug use: No  . Sexual activity: Not on file  Lifestyle  . Physical activity    Days per week: Not on file    Minutes per session: Not on file  . Stress: Not on file  Relationships  . Social Herbalist on phone: Not on file    Gets together: Not on file    Attends religious service: Not on file    Active member of club or organization: Not on file    Attends meetings of clubs or organizations: Not on file    Relationship status: Not on file  . Intimate partner violence    Fear of current or ex partner: Not on file    Emotionally abused: Not on file    Physically abused: Not on file    Forced sexual activity: Not on file  Other Topics  Concern  . Not on file  Social History Narrative  . Not on file     Review of Systems: Review of Systems  Constitutional: Positive for malaise/fatigue. Negative for chills, diaphoresis, fever and weight loss.  HENT: Negative.  Negative for  congestion, ear discharge, ear pain, hearing loss, nosebleeds, sinus pain, sore throat and tinnitus.   Eyes: Negative for blurred vision, double vision, photophobia, pain, discharge and redness.  Respiratory: Positive for shortness of breath. Negative for cough, hemoptysis, sputum production, wheezing and stridor.   Cardiovascular: Positive for chest pain, orthopnea, leg swelling and PND. Negative for palpitations and claudication.  Gastrointestinal: Negative.  Negative for abdominal pain, blood in stool, constipation, diarrhea, heartburn, melena, nausea and vomiting.  Genitourinary: Negative.  Negative for dysuria, flank pain, frequency, hematuria and urgency.  Musculoskeletal: Negative.  Negative for back pain, falls, joint pain, myalgias and neck pain.  Skin: Negative.  Negative for itching and rash.  Neurological: Negative.  Negative for dizziness, tingling, tremors, sensory change, speech change, focal weakness, seizures, loss of consciousness, weakness and headaches.  Endo/Heme/Allergies: Negative.  Negative for environmental allergies and polydipsia. Does not bruise/bleed easily.  Psychiatric/Behavioral: Negative.  Negative for depression, hallucinations, memory loss, substance abuse and suicidal ideas. The patient is not nervous/anxious and does not have insomnia.     Vital Signs: Blood pressure 130/78, pulse 61, temperature 97.9 F (36.6 C), temperature source Oral, resp. rate 18, height '5\' 7"'  (1.702 m), weight 72.8 kg, SpO2 95 %.  Weight trends: Filed Weights   11/06/18 0250 11/07/18 0318 11/08/18 0603  Weight: 73 kg 73.8 kg 72.8 kg    Physical Exam: General: NAD,   Head: Normocephalic, atraumatic. Moist oral mucosal membranes  Eyes: Anicteric, PERRL  Neck: Supple, trachea midline  Lungs:  Clear to auscultation  Heart: Regular rate and rhythm  Abdomen:  Soft, nontender,   Extremities:  +++ peripheral edema.  Neurologic: Nonfocal, moving all four extremities  Skin: No  lesions  Access: none     Lab results: Basic Metabolic Panel: Recent Labs  Lab 11/06/18 0544 11/07/18 0437 11/08/18 0411  NA 140 140 137  K 3.5 3.6 3.9  CL 106 104 102  CO2 '26 25 28  ' GLUCOSE 82 149* 224*  BUN 43* 46* 48*  CREATININE 2.35* 2.49* 2.62*  CALCIUM 8.3* 8.3* 8.3*    Liver Function Tests: Recent Labs  Lab 11/08/18 0411  AST 20  ALT 17  ALKPHOS 65  BILITOT 0.9  PROT 4.7*  ALBUMIN 2.2*   No results for input(s): LIPASE, AMYLASE in the last 168 hours. No results for input(s): AMMONIA in the last 168 hours.  CBC: Recent Labs  Lab 11/04/18 1148 11/05/18 0411 11/07/18 0437 11/08/18 0411  WBC 7.7 5.6 4.9 5.5  HGB 13.6 11.6* 10.0* 9.6*  HCT 40.8 35.4* 30.6* 29.4*  MCV 89.3 91.9 92.7 92.5  PLT 212 166 146* 141*    Cardiac Enzymes: Recent Labs  Lab 11/04/18 1148  TROPONINI 0.05*    BNP: Invalid input(s): POCBNP  CBG: Recent Labs  Lab 11/07/18 1440 11/07/18 1636 11/07/18 2149 11/08/18 0858 11/08/18 1112  GLUCAP 112* 199* 300* 208* 325*    Microbiology: Results for orders placed or performed during the hospital encounter of 11/04/18  SARS Coronavirus 2 (CEPHEID - Performed in [place] hospital lab), Hosp Order     Status: None   Collection Time: 11/04/18  2:38 PM   Specimen: Nasopharyngeal Swab  Result Value Ref Range Status   SARS Coronavirus 2  NEGATIVE NEGATIVE Final    Comment: (NOTE) If result is NEGATIVE SARS-CoV-2 target nucleic acids are NOT DETECTED. The SARS-CoV-2 RNA is generally detectable in upper and lower  respiratory specimens during the acute phase of infection. The lowest  concentration of SARS-CoV-2 viral copies this assay can detect is 250  copies / mL. A negative result does not preclude SARS-CoV-2 infection  and should not be used as the sole basis for treatment or other  patient management decisions.  A negative result may occur with  improper specimen collection / handling, submission of specimen other   than nasopharyngeal swab, presence of viral mutation(s) within the  areas targeted by this assay, and inadequate number of viral copies  (<250 copies / mL). A negative result must be combined with clinical  observations, patient history, and epidemiological information. If result is POSITIVE SARS-CoV-2 target nucleic acids are DETECTED. The SARS-CoV-2 RNA is generally detectable in upper and lower  respiratory specimens dur ing the acute phase of infection.  Positive  results are indicative of active infection with SARS-CoV-2.  Clinical  correlation with patient history and other diagnostic information is  necessary to determine patient infection status.  Positive results do  not rule out bacterial infection or co-infection with other viruses. If result is PRESUMPTIVE POSTIVE SARS-CoV-2 nucleic acids MAY BE PRESENT.   A presumptive positive result was obtained on the submitted specimen  and confirmed on repeat testing.  While 2019 novel coronavirus  (SARS-CoV-2) nucleic acids may be present in the submitted sample  additional confirmatory testing may be necessary for epidemiological  and / or clinical management purposes  to differentiate between  SARS-CoV-2 and other Sarbecovirus currently known to infect humans.  If clinically indicated additional testing with an alternate test  methodology [phone]) is advised. The SARS-CoV-2 RNA is generally  detectable in upper and lower respiratory sp ecimens during the acute  phase of infection. The expected result is Negative. Fact Sheet for Patients:  StrictlyIdeas.no Fact Sheet for Healthcare Providers: BankingDealers.co.za This test is not yet approved or cleared by the [place] FDA and has been authorized for detection and/or diagnosis of SARS-CoV-2 by FDA under an Emergency Use Authorization (EUA).  This EUA will remain in effect (meaning this test can be used) for the duration of  the COVID-19 declaration under Section 564(b)(1) of the Act, 21 U.S.C. section 360bbb-3(b)(1), unless the authorization is terminated or revoked sooner. Performed at [facility], [address] [place], [place] [address]     Coagulation Studies: No results for input(s): LABPROT, INR in the last 72 hours.  Urinalysis: Recent Labs    11/07/18 2236  COLORURINE YELLOW*  LABSPEC 1.009  PHURINE 5.0  GLUCOSEU 50*  HGBUR NEGATIVE  BILIRUBINUR NEGATIVE  KETONESUR NEGATIVE  PROTEINUR 100*  NITRITE NEGATIVE  LEUKOCYTESUR NEGATIVE      Imaging: Nm Myocar Multi W/spect W/wall Motion / Ef  Result Date: 11/07/2018  Blood pressure demonstrated a normal response to exercise.  The left ventricular ejection fraction is severely decreased (<30%).  This is a low risk study.  The study is normal.       Assessment & Plan: Kevin Gibson is a 78 y.o. 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 Acute renal failure on chronic kidney  disease with subnephrotic range proteinuria and anasarca.  Baseline creatinine of 2.2, GFR of 28 on 05/13/2018. 3.5 grams of proteinuria at that time.  Chronic kidney disease secondary to diabetic nephropathy Acute renal failure could be progression of chronic kidney disease versus acute injury from acute cardiorenal syndrome versus vasculitis.  No IV contrast exposure No acute indication for dialysis - Continue IV furosemide - Start IV albumin - Serologic work up for glomerulonephritis - Pending renal ultrasound  2. Hypertension: well controlled. Home regimen of labetalol and amlodipine  Started on IV furosemide   3. Acute exacerbation of systolic congestive heart failure: EF  20-25%.   4. Diabetes mellitus type II with chronic kidney disease: poorly controlled. Hemoglobin A1c of 12.1%.  Insulin dependent.     LOS: 4 Karlea Mckibbin 6/19/20201:24 PM

## 2018-11-08 NOTE — Progress Notes (Signed)
Physical Therapy Treatment Patient Details Name: Kevin Gibson MRN: [id number] DOB: August 22, 1940 Today's Date: 11/08/2018    History of Present Illness Per MD note:Kevin Gibson  is a 78 y.o. 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  He has noticed significant swelling and bilateral lower extremities and right upper extremity in the last 5 days.  No history of DVT.  Patient here has been found to have pulmonary edema on chest x-ray, significantly elevated BNP of greater than 4500.  Oxygen saturations at times dropped into the high 80s.  Patient is being admitted for CHF needing IV diuresis as inpatient.  Blood sugars found to be 427.  Patient stopped taking his insulin a few months back.    PT Comments    Pt in bed, resting 2 lpm 97% HR 60. Agrees to gait.  Bed mobility and transfers without assist.  Amb into hallway with no AD and slow steady gait but returns to room due to SOB.  Sats 88% on 2 lpm after gait.    While pt is generally steady with gait, he may benefit from a rollator walker upon discharge for energy conservation and having a seat to rest on/place to store a portable O2 tank during gait.  He voiced understanding.  Pt would also benefit from a wheelchair as gait is limited and he would have difficulty managing household and community distances at this time.     Patient suffers from sick sinus syndrom which impairs his/her ability to perform daily activities like toileting, feeding, dressing, grooming, bathing in the home. A walker will not resolve the patient's issue with performing activities of daily living. A wheelchair is required/recommended and will allow patient to safely perform daily activities.   Patient can safely propel the wheelchair in the home or has a caregiver who  can provide assistance.    Follow Up Recommendations        Equipment Recommendations  Rolling walker with 5" wheels;Wheelchair cushion (measurements PT);Wheelchair (measurements PT)  3-in 1 commode   Recommendations for Other Services       Precautions / Restrictions Precautions Precautions: None Restrictions Weight Bearing Restrictions: No    Mobility  Bed Mobility Overal bed mobility: Modified Independent                Transfers Overall transfer level: Modified independent                  Ambulation/Gait Ambulation/Gait assistance: Modified independent (Device/Increase time) Gait Distance (Feet): 35 Feet Assistive device: None Gait Pattern/deviations: Step-through pattern;Decreased step length - right;Decreased step length - left     General Gait Details: slow steady gait   Stairs             Wheelchair Mobility    Modified Rankin (Stroke Patients Only)       Balance Overall balance assessment: Mild deficits observed, not formally tested                                          Cognition Arousal/Alertness: Awake/alert Behavior During Therapy: WFL for tasks assessed/performed Overall Cognitive Status: Within Functional Limits for tasks assessed  Exercises      General Comments        Pertinent Vitals/Pain Pain Assessment: No/denies pain    Home Living                      Prior Function            PT Goals (current goals can now be found in the care plan section) Progress towards PT goals: Progressing toward goals    Frequency    Min 2X/week      PT Plan Current plan remains appropriate    Co-evaluation              AM-PAC PT "6 Clicks" Mobility   Outcome Measure  Help needed turning from your back to your side while in a flat bed without using bedrails?: None Help needed moving from lying on your back to sitting on  the side of a flat bed without using bedrails?: None Help needed moving to and from a bed to a chair (including a wheelchair)?: None Help needed standing up from a chair using your arms (e.g., wheelchair or bedside chair)?: None Help needed to walk in hospital room?: None Help needed climbing 3-5 steps with a railing? : A Little 6 Click Score: 23    End of Session Equipment Utilized During Treatment: Gait belt;Oxygen   Patient left: in bed;with bed alarm set;with call bell/phone within reach         Time: 1200-1212 PT Time Calculation (min) (ACUTE ONLY): 12 min  Charges:  $Gait Training: 8-22 mins                     Chesley Noon, PTA 11/08/18, 12:38 PM

## 2018-11-08 NOTE — TOC Progression Note (Signed)
Transition of Care [facility]) - Progression Note    Patient Details  Name: Kevin Gibson MRN: [id number] Date of Birth: 1940/09/27  Transition of Care [facility]) CM/SW Contact  [name], [place] Phone Number: 11/08/2018, 5:35 PM  Clinical Narrative:     CSW attempted to set up home health, [facility] said patient out of network, [name] not able to accept patient, and [name] said to check back on Sunday or Monday to see if they can accept him.  Expected Discharge Plan: Home/Self Care Barriers to Discharge: Continued Medical Work up  Expected Discharge Plan and Services Expected Discharge Plan: Home/Self Care In-house Referral: Clinical Social Work Discharge Planning Services: Other - See comment(Scale provided for patient)   Living arrangements for the past 2 months: Single Family Home Expected Discharge Date: 11/06/18               DME Arranged: N/A DME Agency: NA       HH Arranged: NA HH Agency: NA         Social Determinants of Health (SDOH) Interventions    Readmission Risk Interventions No flowsheet data found.

## 2018-11-08 NOTE — Progress Notes (Signed)
Cardiac Rehab Navigator/ Exercise Physiologist Note  Rounded on patient today with follow-up to previous education on CHF and the 5 Simple Steps to Living Better with Heart Failure.  This EP encouraged patient at his baseline, he would be candidate for Rehab to work on strength, stamina, shortness of breath, and endurance. Patient will complete home health sessions before rehab program readiness. This EP will attempt to schedule orientation time with patient before discharge. Patient is not a candidate for Virtual Rehab due to equipment barriers. If unable to schedule orientation appointment with patient before discharge, the dept will send appointment time, place, and contact information by mail to patient.   Jasper Loser, [facility]  Exercise Physiologist Department Phone #: [phone] Fax: [phone]  Direct Line [phone] Email Address: [email or web address]

## 2018-11-08 NOTE — Progress Notes (Signed)
[facility] [place] NAME: Cornellius Kropp    MR#:  [id number]  DATE OF BIRTH:  03-Jul-1940  SUBJECTIVE:  CHIEF COMPLAINT:  Patient continues to have significant swelling and dyspnea on exertion   REVIEW OF SYSTEMS:  CONSTITUTIONAL: No fever, fatigue or weakness.  EYES: No blurred or double vision.  EARS, NOSE, AND THROAT: No tinnitus or ear pain.  RESPIRATORY: No cough, shortness of breath, wheezing or hemoptysis.  CARDIOVASCULAR: No chest pain, orthopnea, positive edema.  GASTROINTESTINAL: No nausea, vomiting, diarrhea or abdominal pain.  GENITOURINARY: No dysuria, hematuria.  ENDOCRINE: No polyuria, nocturia,  HEMATOLOGY: No anemia, easy bruising or bleeding SKIN: No rash or lesion. MUSCULOSKELETAL: No joint pain or arthritis.   NEUROLOGIC: No tingling, numbness, weakness.  PSYCHIATRY: No anxiety or depression.   DRUG ALLERGIES:  No Known Allergies  VITALS:  Blood pressure 130/78, pulse 61, temperature 97.9 F (36.6 C), temperature source Oral, resp. rate 18, height 5\' 7"  (1.702 m), weight 72.8 kg, SpO2 95 %.  PHYSICAL EXAMINATION:  GENERAL:  78 y.o.-year-old patient lying in the bed with no acute distress.  EYES: Pupils equal, round, reactive to light and accommodation. No scleral icterus. Extraocular muscles intact.  HEENT: Head atraumatic, normocephalic. Oropharynx and nasopharynx clear.  NECK:  Supple, no jugular venous distention. No thyroid enlargement, no tenderness.  LUNGS: Normal breath sounds bilaterally, no wheezing, rales,rhonchi or crepitation. No use of accessory muscles of respiration.  CARDIOVASCULAR: S1, S2 normal. No murmurs, rubs, or gallops.  ABDOMEN: Soft, nontender, nondistended. Bowel sounds present. EXTREMITIES: Significant pedal edema and left upper extremity pitting edema, 2+, no cyanosis, or clubbing.  NEUROLOGIC: Cranial nerves II through XII are intact. Muscle strength 5/5 in all extremities.  Sensation intact. Gait not checked.  PSYCHIATRIC: The patient is alert and oriented x 3.  SKIN: No obvious rash, lesion, or ulcer.    LABORATORY PANEL:   CBC Recent Labs  Lab 11/08/18 0411  WBC 5.5  HGB 9.6*  HCT 29.4*  PLT 141*   ------------------------------------------------------------------------------------------------------------------  Chemistries  Recent Labs  Lab 11/08/18 0411  NA 137  K 3.9  CL 102  CO2 28  GLUCOSE 224*  BUN 48*  CREATININE 2.62*  CALCIUM 8.3*  AST 20  ALT 17  ALKPHOS 65  BILITOT 0.9   ------------------------------------------------------------------------------------------------------------------  Cardiac Enzymes Recent Labs  Lab 11/04/18 1148  TROPONINI 0.05*   ------------------------------------------------------------------------------------------------------------------  RADIOLOGY:  Nm Myocar Multi W/spect W/wall Motion / Ef  Result Date: 11/07/2018  Blood pressure demonstrated a normal response to exercise.  The left ventricular ejection fraction is severely decreased (<30%).  This is a low risk study.  The study is normal.     EKG:   Orders placed or performed during the hospital encounter of 11/04/18  . EKG 12-Lead  . EKG 12-Lead  . ED EKG  . ED EKG    ASSESSMENT AND PLAN:   *Acute on chronic systolic congestive heart failure.   Patient still has significant swelling I checked his albumin level very low 2.2 Continue IV Lasix will also benefit from IV albumin   *AKI on CKD stage III.  Baseline creatinine at 2 Baseline creatinine 2.1, creatinine 2.62 Monitor while being diuresed.   Repeat labs in the morning Nephrology consulted, Dr. Orpha Bur is aware of the consult   *Diabetes mellitus.  Insulin-dependent. 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  But symptoms started 5 days prior to admission ,due to his symptoms and significant swelling patient is started on Eliquis.  *Essential hypertension continue Norvasc and labetalol  *Severe hypo-hypoalbuminemia suspect due to nephrotic syndrome will discuss with nephrology regarding IV albumin  All the records are reviewed and case discussed with Care Management/Social Workerr. Management plans discussed with the patient, he is  in agreement.  CODE STATUS: fc  TOTAL TIME TAKING CARE OF THIS PATIENT:35  minutes.   POSSIBLE D/C IN 2-3 DAYS, DEPENDING ON CLINICAL CONDITION.  Note: This dictation was prepared with Dragon dictation along with smaller phrase technology. Any transcriptional errors that result from this process are unintentional.   Dustin Flock M.D on 11/08/2018 at 12:18 PM  Between 7am to 6pm - Pager - [phone] After 6pm go to [email or web address] - password [email or web address] [facility]  [place] [facility]  Office  [phone]  CC: Primary care physician; Kirk Ruths, MD

## 2018-11-08 NOTE — Consult Note (Signed)
ANTICOAGULATION CONSULT NOTE - Follow Up Consult  Pharmacy Consult for Eliquis Indication: SVT - no evidence of DVT   No Known Allergies  Patient Measurements: Height: 5\' 7"  (170.2 cm) Weight: 160 lb 9.6 oz (72.8 kg) IBW/kg (Calculated) : 66.1  Vital Signs: Temp: 97.9 F (36.6 C) (06/19 0728) Temp Source: Oral (06/19 0728) BP: 130/78 (06/19 0728) Pulse Rate: 61 (06/19 0728)  Labs: Recent Labs    11/06/18 0544 11/07/18 0437 11/08/18 0411  HGB  --  10.0* 9.6*  HCT  --  30.6* 29.4*  PLT  --  146* 141*  CREATININE 2.35* 2.49* 2.62*    Estimated Creatinine Clearance: 22.4 mL/min (A) (by C-G formula based on SCr of 2.62 mg/dL (H)).   Medications:  No prior to admission anticoagulation of note  Assessment: Pharmacy has been consulted for Eliquis dosing for DVT treatment in this 78 y.o. male. Per Cards, appropriate to continue apixaban but will need to evaluate duration of AC in outpatient setting. CBC stable.   Goal of Therapy:  Monitor platelets by anticoagulation protocol: Yes   Plan:  Will give Eliquis 10mg  bid for 7 days, followed by Eliquis 5mg  bid  Will monitor CBC and Scr a minimum of q3 days per protocol.  Oswald Hillock, PharmD, BCPS Clinical Pharmacist 11/08/2018 8:39 AM

## 2018-11-08 NOTE — Plan of Care (Signed)
  Problem: Education: Goal: Knowledge of General Education information will improve Description: Including pain rating scale, medication(s)/side effects and non-pharmacologic comfort measures Outcome: Progressing   Problem: Education: Goal: Ability to demonstrate management of disease process will improve Outcome: Progressing   Problem: Activity: Goal: Capacity to carry out activities will improve Outcome: Progressing   

## 2018-11-08 NOTE — Progress Notes (Signed)
[facility] Cardiology  SUBJECTIVE:  Kevin Gibson a 78 y.o.with a past medical history of sick sinus syndrome s/p dual chamber pacemaker placement in07/2015, HTN, diabetes mellitus, CKD, and obstructive sleep apnea,who presented to the [facility] 06/15 for progressive shortness of breath, left arm swelling, and lower extremity swelling that begun ~1 week prior to his admission - now found to be in new onset heart failure with significantly reduced EF of 20-25%. On reassessment today, he reports feeling well. His breathing and swelling have improved, although he still endorses significant lower extremity pitting edema. He denies any chest pain, lightheadedness, syncope, or palpitations.   Vitals:   11/07/18 1957 11/07/18 2025 11/08/18 0603 11/08/18 0728  BP:  101/61 117/67 130/78  Pulse:  60 63 61  Resp:  20 18 18   Temp:  (!) 97.4 F (36.3 C) 98.3 F (36.8 C) 97.9 F (36.6 C)  TempSrc:  Oral Oral Oral  SpO2: 99% 96% 92% 95%  Weight:   72.8 kg   Height:         Intake/Output Summary (Last 24 hours) at 11/08/2018 0755 Last data filed at 11/08/2018 0453 Gross per 24 hour  Intake -  Output 800 ml  Net -800 ml    PHYSICAL EXAM  General: Well developed, well nourished, in no acute distress HEENT:  Normocephalic and atramatic Lungs: Clear bilaterally to auscultation and percussion. Heart: HRRR . Normal S1 and S2 without gallops or murmurs.  Abdomen: Abdomen soft and non-tender  Extremities: Pitting edema to bilateral lower extremities up to the mid-shins bilaterally. Also significant pitting edema to his left upper extremity. Neuro: Alert and oriented X 3. Psych:  Good affect, responds appropriately  LABS: Basic Metabolic Panel: Recent Labs    11/07/18 0437 11/08/18 0411  NA 140 137  K 3.6 3.9  CL 104 102  CO2 25 28  GLUCOSE 149* 224*  BUN 46* 48*  CREATININE 2.49* 2.62*  CALCIUM 8.3* 8.3*   Liver Function Tests: No results for input(s): AST, ALT, ALKPHOS, BILITOT, PROT,  ALBUMIN in the last 72 hours. No results for input(s): LIPASE, AMYLASE in the last 72 hours. CBC: Recent Labs    11/07/18 0437 11/08/18 0411  WBC 4.9 5.5  HGB 10.0* 9.6*  HCT 30.6* 29.4*  MCV 92.7 92.5  PLT 146* 141*   Cardiac Enzymes: No results for input(s): CKTOTAL, CKMB, CKMBINDEX, TROPONINI in the last 72 hours. BNP: Invalid input(s): POCBNP D-Dimer: No results for input(s): DDIMER in the last 72 hours. Hemoglobin A1C: No results for input(s): HGBA1C in the last 72 hours. Fasting Lipid Panel: No results for input(s): CHOL, HDL, LDLCALC, TRIG, CHOLHDL, LDLDIRECT in the last 72 hours. Thyroid Function Tests: Recent Labs    11/07/18 0437  TSH 3.041   Anemia Panel: No results for input(s): VITAMINB12, FOLATE, FERRITIN, TIBC, IRON, RETICCTPCT in the last 72 hours.  Nm Myocar Multi W/spect W/wall Motion / Ef  Result Date: 11/07/2018  Blood pressure demonstrated a normal response to exercise.  The left ventricular ejection fraction is severely decreased (<30%).  This is a low risk study.  The study is normal.     ASSESSMENT AND PLAN:  Kevin Gibson is a 78 year old male with hx of sick sinus syndrome s/p dual chamber pacer placement in 2015, HTN, diabetes mellitus, and obstructive sleep apnea (not using CPAP), who presented to the ED with shortness of breath, pedal edema, and evidence of new onset heart failure. Has received diuresis with minimal improvement of symptoms.  Active Problems:   CHF (congestive heart failure) (HCC)   New onset systolic heart failure:  CXR with bilateral pleural effusions, BNP significantly elevated to 4500, and patient with overt signs of fluid overload on exam. ECHO completed yesterday showed significantly reduced LV function with EF of 20-25%. Minimal troponin elevation likely secondary to demand ischemia. Lower suspicion for ACS. There is no evidence of anemia, or current infection contributing. He denies any alcohol use - so  alcohol-induced cardiomyopathy less likely. TSH completed today was within normal limits, ruling out thyroid cause of his heart failure. Will plan to stabilize and manage his heart failure symptoms with diuresis. - Continue diuresis with IV lasix 80 mg BID given significant edema still on exam  - Lexiscan stress test without signs of myocardial ischemia - less likely ischemic cause of his heart failure - Maximize heart failure managementby addingACE/ARB when appropriate for further management of heart failure  - Long term treatment options include work up of sleep apnea in the outpatient setting, possible upgrade from pacer to bi-ventricular pacing device, and optimization of his medication regimen with switch to alternative beta blocker at a later date  Superficial vein thrombosis:  Based on ultrasound report, there was no evidence of DVT to his left upper extremity. 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 Anticoagulation not required for superficial vein thrombosis.  Consider d/c of Eliquis.  Troponin elevation: Minimal troponin elevation likely secondary to demand ischemia. Lower suspicion for ACS. Stress test without evidence of ischemic. - No further intervention required  Hilbert Odor, PA-C 11/08/2018 7:55 AM

## 2018-11-09 LAB — CBC
HCT: 29.6 % — ABNORMAL LOW (ref 39.0–52.0)
Hemoglobin: 9.6 g/dL — ABNORMAL LOW (ref 13.0–17.0)
MCH: 29.7 pg (ref 26.0–34.0)
MCHC: 32.4 g/dL (ref 30.0–36.0)
MCV: 91.6 fL (ref 80.0–100.0)
Platelets: 143 10*3/uL — ABNORMAL LOW (ref 150–400)
RBC: 3.23 MIL/uL — ABNORMAL LOW (ref 4.22–5.81)
RDW: 14.4 % (ref 11.5–15.5)
WBC: 5.6 10*3/uL (ref 4.0–10.5)
nRBC: 0 % (ref 0.0–0.2)

## 2018-11-09 LAB — GLUCOSE, CAPILLARY
Glucose-Capillary: 118 mg/dL — ABNORMAL HIGH (ref 70–99)
Glucose-Capillary: 161 mg/dL — ABNORMAL HIGH (ref 70–99)
Glucose-Capillary: 247 mg/dL — ABNORMAL HIGH (ref 70–99)
Glucose-Capillary: 229 mg/dL — ABNORMAL HIGH (ref 70–99)

## 2018-11-09 LAB — BASIC METABOLIC PANEL
Anion gap: 8 (ref 5–15)
BUN: 52 mg/dL — ABNORMAL HIGH (ref 8–23)
CO2: 29 mmol/L (ref 22–32)
Calcium: 8.3 mg/dL — ABNORMAL LOW (ref 8.9–10.3)
Chloride: 101 mmol/L (ref 98–111)
Creatinine, Ser: 2.82 mg/dL — ABNORMAL HIGH (ref 0.61–1.24)
GFR calc Af Amer: 24 mL/min — ABNORMAL LOW (ref >60)
GFR calc non Af Amer: 21 mL/min — ABNORMAL LOW (ref >60)
Glucose, Bld: 86 mg/dL (ref 70–99)
Potassium: 4.1 mmol/L (ref 3.5–5.1)
Sodium: 138 mmol/L (ref 135–145)

## 2018-11-09 LAB — HIV ANTIBODY (ROUTINE TESTING W REFLEX): HIV Screen 4th Generation wRfx: NONREACTIVE

## 2018-11-09 LAB — C3 COMPLEMENT: C3 Complement: 88 mg/dL (ref 82–167)

## 2018-11-09 LAB — HEPATITIS B SURFACE ANTIBODY,QUALITATIVE: Hep B S Ab: NONREACTIVE

## 2018-11-09 LAB — C4 COMPLEMENT: Complement C4, Body Fluid: 16 mg/dL (ref 14–44)

## 2018-11-09 LAB — PARATHYROID HORMONE, INTACT (NO CA): PTH: 45 pg/mL (ref 15–65)

## 2018-11-09 LAB — ANA W/REFLEX: Anti Nuclear Antibody (ANA): NEGATIVE

## 2018-11-09 LAB — HEPATITIS B SURFACE ANTIGEN: Hepatitis B Surface Ag: NEGATIVE

## 2018-11-09 LAB — HEPATITIS B CORE ANTIBODY, IGM: Hep B C IgM: NEGATIVE

## 2018-11-09 LAB — HEPATITIS C ANTIBODY: HCV Ab: <0.1 s/co ratio (ref 0.0–0.9)

## 2018-11-09 MED ORDER — FUROSEMIDE 10 MG/ML IJ SOLN
10.0000 mg/h | INTRAVENOUS | Status: AC
  Administered 2018-11-09: 8 mg/h via INTRAVENOUS
  Filled 2018-11-09: qty 25

## 2018-11-09 NOTE — Progress Notes (Signed)
[place] [facility] [place] NAME: Kevin Gibson    MR#:  [id number]  DATE OF BIRTH:  1940/12/13  SUBJECTIVE:    Patient continues to have significant generalized swelling and dyspnea on exertion   REVIEW OF SYSTEMS:  CONSTITUTIONAL: No fever, fatigue or weakness.  EYES: No blurred or double vision.  EARS, NOSE, AND THROAT: No tinnitus or ear pain.  RESPIRATORY: No cough, shortness of breath, wheezing or hemoptysis.  CARDIOVASCULAR: No chest pain, orthopnea, positive edema.  GASTROINTESTINAL: No nausea, vomiting, diarrhea or abdominal pain.  GENITOURINARY: No dysuria, hematuria.  ENDOCRINE: No polyuria, nocturia,  HEMATOLOGY: No anemia, easy bruising or bleeding SKIN: No rash or lesion. MUSCULOSKELETAL: No joint pain or arthritis.   NEUROLOGIC: No tingling, numbness, weakness.  PSYCHIATRY: No anxiety or depression.   DRUG ALLERGIES:  No Known Allergies  VITALS:  Blood pressure 123/69, pulse (!) 59, temperature 98.3 F (36.8 C), temperature source Oral, resp. rate 18, height 5\' 7"  (1.702 m), weight 72.4 kg, SpO2 98 %.  PHYSICAL EXAMINATION:  GENERAL:  78 y.o.-year-old patient lying in the bed with no acute distress. Anasarca+ EYES: Pupils equal, round, reactive to light and accommodation. No scleral icterus. Extraocular muscles intact.  HEENT: Head atraumatic, normocephalic. Oropharynx and nasopharynx clear.  NECK:  Supple, no jugular venous distention. No thyroid enlargement, no tenderness.  LUNGS: Normal breath sounds bilaterally, no wheezing, rales,rhonchi or crepitation. No use of accessory muscles of respiration.  CARDIOVASCULAR: S1, S2 normal. No murmurs, rubs, or gallops.  ABDOMEN: Soft, nontender, nondistended. Bowel sounds present. EXTREMITIES: Significant pedal edema and left upper extremity pitting edema, 2+, no cyanosis, or clubbing.  NEUROLOGIC: Cranial nerves II through XII are intact. Muscle strength 5/5 in all  extremities. Sensation intact. Gait not checked.  PSYCHIATRIC: The patient is alert and oriented x 3.  SKIN: No obvious rash, lesion, or ulcer.    LABORATORY PANEL:   CBC Recent Labs  Lab 11/09/18 0439  WBC 5.6  HGB 9.6*  HCT 29.6*  PLT 143*   ------------------------------------------------------------------------------------------------------------------  Chemistries  Recent Labs  Lab 11/08/18 0411 11/09/18 0439  NA 137 138  K 3.9 4.1  CL 102 101  CO2 28 29  GLUCOSE 224* 86  BUN 48* 52*  CREATININE 2.62* 2.82*  CALCIUM 8.3* 8.3*  AST 20  --   ALT 17  --   ALKPHOS 65  --   BILITOT 0.9  --    ------------------------------------------------------------------------------------------------------------------  Cardiac Enzymes Recent Labs  Lab 11/04/18 1148  TROPONINI 0.05*   ------------------------------------------------------------------------------------------------------------------  RADIOLOGY:  US Renal  Result Date: 11/08/2018 CLINICAL DATA:  Acute kidney failure, stage IV chronic kidney disease, hypertension, diabetes mellitus EXAM: RENAL / URINARY TRACT ULTRASOUND COMPLETE COMPARISON:  10/09/2013 FINDINGS: Right Kidney: Renal measurements: 6.9 x 3.4 x 3.6 cm = volume: 46 mL. Marked cortical thinning. Increased cortical echogenicity. Cysts are identified at the upper pole 2.2 x 1.8 x 2.3 cm and lower pole 2.5 x 1.7 x 2.5 cm. No additional masses or hydronephrosis. Left Kidney: Renal measurements: 13.1 x 6.6 x 6.6 cm = volume: 303 mL. Normal cortical thickness. Increased cortical echogenicity. Small cyst at upper pole 3.5 x 3.0 x 2.7 cm. Additional peripelvic cyst mid kidney 3.9 x 3.6 x 3.9 cm. No additional masses or hydronephrosis. No shadowing calculi. Bladder: Contains minimal urine, inadequately evaluated. Incidentally noted minimal perihepatic ascites and small BILATERAL pleural effusions. IMPRESSION: Medical renal disease changes of both kidneys. RIGHT renal  cortical atrophy. BILATERAL  simple appearing renal cysts. Small BILATERAL pleural effusions and minimal ascites. Electronically Signed   By: Lavonia Dana M.D.   On: 11/08/2018 14:56    EKG:   Orders placed or performed during the hospital encounter of 11/04/18  . EKG 12-Lead  . EKG 12-Lead  . ED EKG  . 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   Patient still has significant swelling  albumin level very low 2.2 started IV Lasix gtt with IV albumin -EF 20-25%  *AKI on CKD stage III.  Baseline creatinine at 2 Baseline creatinine 2.1, creatinine 2.62 Monitor while being diuresed.   Nephrology consult with Dr. Juleen China appreciated  *Diabetes mellitus.  Insulin-dependent.  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   -typically we don't treat superficial thrombophlebitis/occlusion. This was discussed with Dr. Juleen China. I agree with discontinuing eliquis given other several comorbidities and risk for G.I. bleed  *Essential hypertension  continue Norvasc and labetalol  *Severe hypo-hypoalbuminemia suspect due to nephrotic syndrome - per nephrology  Tempted to call patient's daughter Emelia Loron-- her voicemail is not set up. Ph Number [phone] CODE STATUS: fc  TOTAL TIME TAKING CARE OF THIS PATIENT:25  minutes.   POSSIBLE D/C IN few DAYS, DEPENDING ON CLINICAL CONDITION.  Note: This dictation was prepared with Dragon dictation along with smaller phrase technology. Any transcriptional errors that result from this process are unintentional.   Fritzi Mandes M.D on 11/09/2018 at 3:01 PM  Between 7am to 6pm - Pager - [phone] After 6pm go to [email or web address] - password [email or web address] [facility]  [place] [facility]  Office  [phone]  CC: Primary care physician; Kirk Ruths, MD

## 2018-11-09 NOTE — Progress Notes (Signed)
[facility]  ROUNDING NOTE   Subjective:   Patient states he does not feel any improvement.   Objective:  Vital signs in last 24 hours:  Temp:  [97.4 F (36.3 C)-98.3 F (36.8 C)] 98.3 F (36.8 C) (06/20 0746) Pulse Rate:  [59-69] 59 (06/20 0746) Resp:  [18-20] 18 (06/20 0746) BP: (100-123)/(59-71) 123/69 (06/20 0746) SpO2:  [94 %-100 %] 98 % (06/20 0746) Weight:  [72.4 kg] 72.4 kg (06/20 0356)  Weight change: -0.408 kg Filed Weights   11/07/18 0318 11/08/18 0603 11/09/18 0356  Weight: 73.8 kg 72.8 kg 72.4 kg    Intake/Output: I/O last 3 completed shifts: In: 240 [P.O.:240] Out: 905 [Urine:905]   Intake/Output this shift:  Total I/O In: 0  Out: 300 [Urine:300]  Physical Exam: General: NAD,   Head: Normocephalic, atraumatic. Moist oral mucosal membranes  Eyes: Anicteric, PERRL  Neck: Supple, trachea midline  Lungs:  Bilateral crackles  Heart: Regular rate and rhythm  Abdomen:  +distended  Extremities:  +++ peripheral edema.  Neurologic: Nonfocal, moving all four extremities  Skin: No lesions        Basic Metabolic Panel: Recent Labs  Lab 11/05/18 0411 11/06/18 0544 11/07/18 0437 11/08/18 0411 11/09/18 0439  NA 139 140 140 137 138  K 3.8 3.5 3.6 3.9 4.1  CL 106 106 104 102 101  CO2 '25 26 25 28 29  ' GLUCOSE 136* 82 149* 224* 86  BUN 38* 43* 46* 48* 52*  CREATININE 2.23* 2.35* 2.49* 2.62* 2.82*  CALCIUM 8.4* 8.3* 8.3* 8.3* 8.3*    Liver Function Tests: Recent Labs  Lab 11/08/18 0411  AST 20  ALT 17  ALKPHOS 65  BILITOT 0.9  PROT 4.7*  ALBUMIN 2.2*   No results for input(s): LIPASE, AMYLASE in the last 168 hours. No results for input(s): AMMONIA in the last 168 hours.  CBC: Recent Labs  Lab 11/04/18 1148 11/05/18 0411 11/07/18 0437 11/08/18 0411 11/09/18 0439  WBC 7.7 5.6 4.9 5.5 5.6  HGB 13.6 11.6* 10.0* 9.6* 9.6*  HCT 40.8 35.4* 30.6* 29.4* 29.6*  MCV 89.3 91.9 92.7 92.5 91.6  PLT 212 166 146* 141* 143*     Cardiac Enzymes: Recent Labs  Lab 11/04/18 1148  TROPONINI 0.05*    BNP: Invalid input(s): POCBNP  CBG: Recent Labs  Lab 11/08/18 1112 11/08/18 1620 11/08/18 2115 11/09/18 0748 11/09/18 1141  GLUCAP 325* 270* 150* 118* 161*    Microbiology: Results for orders placed or performed during the hospital encounter of 11/04/18  SARS Coronavirus 2 (CEPHEID - Performed in [place] hospital lab), Hosp Order     Status: None   Collection Time: 11/04/18  2:38 PM   Specimen: Nasopharyngeal Swab  Result Value Ref Range Status   SARS Coronavirus 2 NEGATIVE NEGATIVE Final    Comment: (NOTE) If result is NEGATIVE SARS-CoV-2 target nucleic acids are NOT DETECTED. The SARS-CoV-2 RNA is generally detectable in upper and lower  respiratory specimens during the acute phase of infection. The lowest  concentration of SARS-CoV-2 viral copies this assay can detect is 250  copies / mL. A negative result does not preclude SARS-CoV-2 infection  and should not be used as the sole basis for treatment or other  patient management decisions.  A negative result may occur with  improper specimen collection / handling, submission of specimen other  than nasopharyngeal swab, presence of viral mutation(s) within the  areas targeted by this assay, and inadequate number of viral copies  (<250 copies /  mL). A negative result must be combined with clinical  observations, patient history, and epidemiological information. If result is POSITIVE SARS-CoV-2 target nucleic acids are DETECTED. The SARS-CoV-2 RNA is generally detectable in upper and lower  respiratory specimens dur ing the acute phase of infection.  Positive  results are indicative of active infection with SARS-CoV-2.  Clinical  correlation with patient history and other diagnostic information is  necessary to determine patient infection status.  Positive results do  not rule out bacterial infection or co-infection with other viruses. If  result is PRESUMPTIVE POSTIVE SARS-CoV-2 nucleic acids MAY BE PRESENT.   A presumptive positive result was obtained on the submitted specimen  and confirmed on repeat testing.  While 2019 novel coronavirus  (SARS-CoV-2) nucleic acids may be present in the submitted sample  additional confirmatory testing may be necessary for epidemiological  and / or clinical management purposes  to differentiate between  SARS-CoV-2 and other Sarbecovirus currently known to infect humans.  If clinically indicated additional testing with an alternate test  methodology [phone]) is advised. The SARS-CoV-2 RNA is generally  detectable in upper and lower respiratory sp ecimens during the acute  phase of infection. The expected result is Negative. Fact Sheet for Patients:  StrictlyIdeas.no Fact Sheet for Healthcare Providers: BankingDealers.co.za This test is not yet approved or cleared by the [place] FDA and has been authorized for detection and/or diagnosis of SARS-CoV-2 by FDA under an Emergency Use Authorization (EUA).  This EUA will remain in effect (meaning this test can be used) for the duration of the COVID-19 declaration under Section 564(b)(1) of the Act, 21 U.S.C. section 360bbb-3(b)(1), unless the authorization is terminated or revoked sooner. Performed at [facility], [address] [place], [place] [address]     Coagulation Studies: No results for input(s): LABPROT, INR in the last 72 hours.  Urinalysis: Recent Labs    11/07/18 2236  COLORURINE YELLOW*  LABSPEC 1.009  PHURINE 5.0  GLUCOSEU 50*  HGBUR NEGATIVE  BILIRUBINUR NEGATIVE  KETONESUR NEGATIVE  PROTEINUR 100*  NITRITE NEGATIVE  LEUKOCYTESUR NEGATIVE      Imaging: US Renal  Result Date: 11/08/2018 CLINICAL DATA:  Acute kidney failure, stage IV chronic kidney disease, hypertension, diabetes mellitus EXAM: RENAL / URINARY TRACT ULTRASOUND COMPLETE  COMPARISON:  10/09/2013 FINDINGS: Right Kidney: Renal measurements: 6.9 x 3.4 x 3.6 cm = volume: 46 mL. Marked cortical thinning. Increased cortical echogenicity. Cysts are identified at the upper pole 2.2 x 1.8 x 2.3 cm and lower pole 2.5 x 1.7 x 2.5 cm. No additional masses or hydronephrosis. Left Kidney: Renal measurements: 13.1 x 6.6 x 6.6 cm = volume: 303 mL. Normal cortical thickness. Increased cortical echogenicity. Small cyst at upper pole 3.5 x 3.0 x 2.7 cm. Additional peripelvic cyst mid kidney 3.9 x 3.6 x 3.9 cm. No additional masses or hydronephrosis. No shadowing calculi. Bladder: Contains minimal urine, inadequately evaluated. Incidentally noted minimal perihepatic ascites and small BILATERAL pleural effusions. IMPRESSION: Medical renal disease changes of both kidneys. RIGHT renal cortical atrophy. BILATERAL simple appearing renal cysts. Small BILATERAL pleural effusions and minimal ascites. Electronically Signed   By: Lavonia Dana M.D.   On: 11/08/2018 14:56   Nm Myocar Multi W/spect W/wall Motion / Ef  Result Date: 11/07/2018  Blood pressure demonstrated a normal response to exercise.  The left ventricular ejection fraction is severely decreased (<30%).  This is a low risk study.  The study is normal.      Medications:   .  albumin human 25 g (11/09/18 1046)  . furosemide (LASIX) infusion 8 mg/hr (11/09/18 1141)   . amLODipine  10 mg Oral Daily  . apixaban  10 mg Oral BID   Followed by  . [START ON 11/11/2018] apixaban  5 mg Oral BID  . aspirin EC  81 mg Oral QODAY  . insulin aspart  0-15 Units Subcutaneous TID WC  . insulin aspart  0-5 Units Subcutaneous QHS  . insulin glargine  8 Units Subcutaneous QHS  . insulin starter kit- syringes  1 kit Other Once  . labetalol  400 mg Oral BID  . living well with diabetes book   Does not apply Once  . sodium chloride flush  3 mL Intravenous Once  . sodium chloride flush  3 mL Intravenous Q12H   acetaminophen **OR** acetaminophen,  albuterol, hydrALAZINE, ondansetron **OR** ondansetron (ZOFRAN) IV, polyethylene glycol  Assessment/ Plan:  Mr. Kevin Gibson is a 78 y.o. 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  Baseline creatinine of 2.2, GFR of 28 on 05/13/2018. 3.5 grams of proteinuria at that time.  Chronic kidney disease secondary to diabetic nephropathy Acute renal failure could be progression of chronic kidney disease versus acute injury from acute cardiorenal syndrome versus vasculitis.  -Change to furosemide gtt - Start IV albumin - Serologic work up for glomerulonephritis pending - Pending renal ultrasound  2. Hypertension: well controlled. Home regimen of labetalol and amlodipine   3. Acute exacerbation of systolic congestive heart failure: EF 20-25%.   4. Diabetes mellitus type II with chronic kidney disease: poorly controlled. Hemoglobin A1c of 12.1%.  Insulin dependent.    LOS: 5 Monico Sudduth 6/20/202012:23 PM

## 2018-11-09 NOTE — Progress Notes (Signed)
Patient ID: Kevin Gibson, male   DOB: 06/10/1940, 78 y.o.   MRN: [id number] Spoke with dter [facility] schmidt

## 2018-11-10 LAB — RENAL FUNCTION PANEL
Albumin: 3 g/dL — ABNORMAL LOW (ref 3.5–5.0)
Anion gap: 8 (ref 5–15)
BUN: 59 mg/dL — ABNORMAL HIGH (ref 8–23)
CO2: 29 mmol/L (ref 22–32)
Calcium: 8.3 mg/dL — ABNORMAL LOW (ref 8.9–10.3)
Chloride: 98 mmol/L (ref 98–111)
Creatinine, Ser: 2.91 mg/dL — ABNORMAL HIGH (ref 0.61–1.24)
GFR calc Af Amer: 23 mL/min — ABNORMAL LOW (ref >60)
GFR calc non Af Amer: 20 mL/min — ABNORMAL LOW (ref >60)
Glucose, Bld: 127 mg/dL — ABNORMAL HIGH (ref 70–99)
Phosphorus: 3.2 mg/dL (ref 2.5–4.6)
Potassium: 4.2 mmol/L (ref 3.5–5.1)
Sodium: 135 mmol/L (ref 135–145)

## 2018-11-10 LAB — GLUCOSE, CAPILLARY
Glucose-Capillary: 64 mg/dL — ABNORMAL LOW (ref 70–99)
Glucose-Capillary: 165 mg/dL — ABNORMAL HIGH (ref 70–99)
Glucose-Capillary: 284 mg/dL — ABNORMAL HIGH (ref 70–99)
Glucose-Capillary: 313 mg/dL — ABNORMAL HIGH (ref 70–99)

## 2018-11-10 MED ORDER — CARVEDILOL 6.25 MG PO TABS
6.2500 mg | ORAL_TABLET | Freq: Two times a day (BID) | ORAL | Status: DC
  Administered 2018-11-10 – 2018-11-11 (×4): 6.25 mg via ORAL
  Filled 2018-11-10 (×4): qty 1

## 2018-11-10 MED ORDER — HYDRALAZINE HCL 25 MG PO TABS
25.0000 mg | ORAL_TABLET | Freq: Three times a day (TID) | ORAL | Status: DC
  Administered 2018-11-10 – 2018-11-15 (×15): 25 mg via ORAL
  Filled 2018-11-10 (×14): qty 1

## 2018-11-10 MED ORDER — FUROSEMIDE 10 MG/ML IJ SOLN
10.0000 mg/h | INTRAVENOUS | Status: DC
  Administered 2018-11-10 – 2018-11-14 (×4): 10 mg/h via INTRAVENOUS
  Filled 2018-11-10 (×4): qty 25

## 2018-11-10 NOTE — Progress Notes (Signed)
[place] [facility] [place] NAME: Kevin Gibson    MR#:  [id number]  DATE OF BIRTH:  1940-09-26  SUBJECTIVE:    No new complaints UOP 1350 cc over 24 hours   REVIEW OF SYSTEMS:  CONSTITUTIONAL: No fever Positive for fatigue  and weakness.  EYES: No blurred or double vision.  EARS, NOSE, AND THROAT: No tinnitus or ear pain.  RESPIRATORY: No cough, shortness of breath, wheezing or hemoptysis.  CARDIOVASCULAR: No chest pain, orthopnea positive edema.  GASTROINTESTINAL: No nausea, vomiting, diarrhea or abdominal pain.  GENITOURINARY: No dysuria, hematuria.  ENDOCRINE: No polyuria, nocturia,  HEMATOLOGY: No anemia, easy bruising or bleeding SKIN: No rash or lesion. MUSCULOSKELETAL: No joint pain or arthritis.   NEUROLOGIC: No tingling, numbness, weakness.  PSYCHIATRY: No anxiety or depression.   DRUG ALLERGIES:  No Known Allergies  VITALS:  Blood pressure 124/69, pulse 61, temperature 98.5 F (36.9 C), resp. rate 19, height 5\' 7"  (1.702 m), weight 71.6 kg, SpO2 97 %.  PHYSICAL EXAMINATION:  GENERAL:  78 y.o.-year-old patient lying in the bed with no acute distress. Anasarca+ EYES: Pupils equal, round, reactive to light and accommodation. No scleral icterus. Extraocular muscles intact.  HEENT: Head atraumatic, normocephalic. Oropharynx and nasopharynx clear.  NECK:  Supple, no jugular venous distention. No thyroid enlargement, no tenderness.  LUNGS: Normal breath sounds bilaterally, no wheezing, rales,rhonchi or crepitation. No use of accessory muscles of respiration.  CARDIOVASCULAR: S1, S2 normal. No murmurs, rubs, or gallops.  ABDOMEN: Soft, nontender, nondistended. Bowel sounds present. EXTREMITIES: Significant pedal edema and left upper extremity pitting edema, 2+, no cyanosis, or clubbing.  NEUROLOGIC: Cranial nerves II through XII are intact. Muscle strength 5/5 in all extremities. Sensation intact. Gait not checked. gen  weakness PSYCHIATRIC: The patient is alert and oriented x 3.  SKIN: No obvious rash, lesion, or ulcer.    LABORATORY PANEL:   CBC Recent Labs  Lab 11/09/18 0439  WBC 5.6  HGB 9.6*  HCT 29.6*  PLT 143*   ------------------------------------------------------------------------------------------------------------------  Chemistries  Recent Labs  Lab 11/08/18 0411  11/10/18 0408  NA 137   < > 135  K 3.9   < > 4.2  CL 102   < > 98  CO2 28   < > 29  GLUCOSE 224*   < > 127*  BUN 48*   < > 59*  CREATININE 2.62*   < > 2.91*  CALCIUM 8.3*   < > 8.3*  AST 20  --   --   ALT 17  --   --   ALKPHOS 65  --   --   BILITOT 0.9  --   --    < > = values in this interval not displayed.   ------------------------------------------------------------------------------------------------------------------  Cardiac Enzymes Recent Labs  Lab 11/04/18 1148  TROPONINI 0.05*   ------------------------------------------------------------------------------------------------------------------  RADIOLOGY:  US Renal  Result Date: 11/08/2018 CLINICAL DATA:  Acute kidney failure, stage IV chronic kidney disease, hypertension, diabetes mellitus EXAM: RENAL / URINARY TRACT ULTRASOUND COMPLETE COMPARISON:  10/09/2013 FINDINGS: Right Kidney: Renal measurements: 6.9 x 3.4 x 3.6 cm = volume: 46 mL. Marked cortical thinning. Increased cortical echogenicity. Cysts are identified at the upper pole 2.2 x 1.8 x 2.3 cm and lower pole 2.5 x 1.7 x 2.5 cm. No additional masses or hydronephrosis. Left Kidney: Renal measurements: 13.1 x 6.6 x 6.6 cm = volume: 303 mL. Normal cortical thickness. Increased cortical echogenicity. Small cyst at upper pole 3.5  x 3.0 x 2.7 cm. Additional peripelvic cyst mid kidney 3.9 x 3.6 x 3.9 cm. No additional masses or hydronephrosis. No shadowing calculi. Bladder: Contains minimal urine, inadequately evaluated. Incidentally noted minimal perihepatic ascites and small BILATERAL pleural  effusions. IMPRESSION: Medical renal disease changes of both kidneys. RIGHT renal cortical atrophy. BILATERAL simple appearing renal cysts. Small BILATERAL pleural effusions and minimal ascites. Electronically Signed   By: Lavonia Dana M.D.   On: 11/08/2018 14:56    EKG:   Orders placed or performed during the hospital encounter of 11/04/18  . EKG 12-Lead  . EKG 12-Lead  . ED EKG  . 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   Patient still has significant swelling  albumin level very low 2.2 started IV Lasix gtt with IV albumin -EF 20-25% -unable to give ACE/ARB due to rising creat -change to po coreg and ?spironolactone -d/c norvasc -start coreg and hydralazine  *AKI on CKD stage III.  Baseline creatinine at 2 Baseline creatinine 2.1, creatinine 2.62--2.91 Nephrology consult with Dr. Juleen China appreciated On IV lasix gtt (June 20th)  *Diabetes mellitus.  Insulin-dependent.  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   -typically we don't treat superficial thrombophlebitis/occlusion. This was discussed with Dr. Juleen China. I agree with discontinuing eliquis given other several comorbidities and risk for G.I. bleed  *Essential hypertension Pt was on Norvasc and labetalol---given anasarca and severe cardiomyopathy now on coreg and hydralazine  *Severe hypo-hypoalbuminemia suspect due to nephrotic syndrome - per nephrology  Spoke with patient's daughter Emelia Loron on June 20th-- her voicemail is not set up. Ph Number [phone] CODE STATUS: fc  TOTAL TIME TAKING CARE OF THIS PATIENT:25  minutes.   POSSIBLE D/C IN few DAYS, DEPENDING ON CLINICAL CONDITION.  Note: This dictation was prepared with Dragon dictation along with smaller phrase technology. Any transcriptional errors that result from this  process are unintentional.   Fritzi Mandes M.D on 11/10/2018 at 7:39 AM  Between 7am to 6pm - Pager - [phone] After 6pm go to [email or web address] - password [email or web address]  [facility]  Office  [phone]  CC: Primary care physician; Kirk Ruths, MD

## 2018-11-10 NOTE — Progress Notes (Signed)
[facility]  ROUNDING NOTE   Subjective:   Furosemide gtt  UOP 1350  2L [place]  Objective:  Vital signs in last 24 hours:  Temp:  [97.6 F (36.4 C)-98.5 F (36.9 C)] 97.9 F (36.6 C) (06/21 0808) Pulse Rate:  [60-63] 60 (06/21 0808) Resp:  [17-20] 17 (06/21 0808) BP: (118-133)/(65-75) 128/75 (06/21 0808) SpO2:  [92 %-97 %] 95 % (06/21 0808) Weight:  [71.6 kg] 71.6 kg (06/21 0330)  Weight change: -0.816 kg Filed Weights   11/08/18 0603 11/09/18 0356 11/10/18 0330  Weight: 72.8 kg 72.4 kg 71.6 kg    Intake/Output: I/O last 3 completed shifts: In: 509.2 [P.O.:240; I.V.:99.3; IV Piggyback:169.9] Out: 1675 [Urine:1675]   Intake/Output this shift:  Total I/O In: 120 [P.O.:120] Out: 800 [Urine:800]  Physical Exam: General: NAD,   Head: Normocephalic, atraumatic. Moist oral mucosal membranes  Eyes: Anicteric, PERRL  Neck: Supple, trachea midline  Lungs:  Bilateral crackles  Heart: Regular rate and rhythm  Abdomen:  +distended  Extremities:  ++ peripheral edema.  Neurologic: Nonfocal, moving all four extremities  Skin: No lesions        Basic Metabolic Panel: Recent Labs  Lab 11/06/18 0544 11/07/18 0437 11/08/18 0411 11/09/18 0439 11/10/18 0408  NA 140 140 137 138 135  K 3.5 3.6 3.9 4.1 4.2  CL 106 104 102 101 98  CO2 _0 GLUCOSE 82 149* 224* 86 127*  BUN 43* 46* 48* 52* 59*  CREATININE 2.35* 2.49* 2.62* 2.82* 2.91*  CALCIUM 8.3* 8.3* 8.3* 8.3* 8.3*  PHOS  --   --   --   --  3.2    Liver Function Tests: Recent Labs  Lab 11/08/18 0411 11/10/18 0408  AST 20  --   ALT 17  --   ALKPHOS 65  --   BILITOT 0.9  --   PROT 4.7*  --   ALBUMIN 2.2* 3.0*   No results for input(s): LIPASE, AMYLASE in the last 168 hours. No results for input(s): AMMONIA in the last 168 hours.  CBC: Recent Labs  Lab 11/04/18 1148 11/05/18 0411 11/07/18 0437 11/08/18 0411 11/09/18 0439  WBC 7.7 5.6 4.9 5.5 5.6  HGB 13.6 11.6* 10.0* 9.6* 9.6*   HCT 40.8 35.4* 30.6* 29.4* 29.6*  MCV 89.3 91.9 92.7 92.5 91.6  PLT 212 166 146* 141* 143*    Cardiac Enzymes: Recent Labs  Lab 11/04/18 1148  TROPONINI 0.05*    BNP: Invalid input(s): POCBNP  CBG: Recent Labs  Lab 11/09/18 1141 11/09/18 1716 11/09/18 2052 11/10/18 0810 11/10/18 1148  GLUCAP 161* 247* 229* 64* 165*    Microbiology: Results for orders placed or performed during the hospital encounter of 11/04/18  SARS Coronavirus 2 (CEPHEID - Performed in [place] hospital lab), Hosp Order     Status: None   Collection Time: 11/04/18  2:38 PM   Specimen: Nasopharyngeal Swab  Result Value Ref Range Status   SARS Coronavirus 2 NEGATIVE NEGATIVE Final    Comment: (NOTE) If result is NEGATIVE SARS-CoV-2 target nucleic acids are NOT DETECTED. The SARS-CoV-2 RNA is generally detectable in upper and lower  respiratory specimens during the acute phase of infection. The lowest  concentration of SARS-CoV-2 viral copies this assay can detect is 250  copies / mL. A negative result does not preclude SARS-CoV-2 infection  and should not be used as the sole basis for treatment or other  patient management decisions.  A negative result may occur with  improper specimen collection / handling, submission of specimen other  than nasopharyngeal swab, presence of viral mutation(s) within the  areas targeted by this assay, and inadequate number of viral copies  (<250 copies / mL). A negative result must be combined with clinical  observations, patient history, and epidemiological information. If result is POSITIVE SARS-CoV-2 target nucleic acids are DETECTED. The SARS-CoV-2 RNA is generally detectable in upper and lower  respiratory specimens dur ing the acute phase of infection.  Positive  results are indicative of active infection with SARS-CoV-2.  Clinical  correlation with patient history and other diagnostic information is  necessary to determine patient infection status.   Positive results do  not rule out bacterial infection or co-infection with other viruses. If result is PRESUMPTIVE POSTIVE SARS-CoV-2 nucleic acids MAY BE PRESENT.   A presumptive positive result was obtained on the submitted specimen  and confirmed on repeat testing.  While 2019 novel coronavirus  (SARS-CoV-2) nucleic acids may be present in the submitted sample  additional confirmatory testing may be necessary for epidemiological  and / or clinical management purposes  to differentiate between  SARS-CoV-2 and other Sarbecovirus currently known to infect humans.  If clinically indicated additional testing with an alternate test  methodology [phone]) is advised. The SARS-CoV-2 RNA is generally  detectable in upper and lower respiratory sp ecimens during the acute  phase of infection. The expected result is Negative. Fact Sheet for Patients:  StrictlyIdeas.no Fact Sheet for Healthcare Providers: BankingDealers.co.za This test is not yet approved or cleared by the [place] FDA and has been authorized for detection and/or diagnosis of SARS-CoV-2 by FDA under an Emergency Use Authorization (EUA).  This EUA will remain in effect (meaning this test can be used) for the duration of the COVID-19 declaration under Section 564(b)(1) of the Act, 21 U.S.C. section 360bbb-3(b)(1), unless the authorization is terminated or revoked sooner. Performed at [facility], [address] [place], [place] [address]     Coagulation Studies: No results for input(s): LABPROT, INR in the last 72 hours.  Urinalysis: Recent Labs    11/07/18 2236  COLORURINE YELLOW*  LABSPEC 1.009  PHURINE 5.0  GLUCOSEU 50*  HGBUR NEGATIVE  BILIRUBINUR NEGATIVE  KETONESUR NEGATIVE  PROTEINUR 100*  NITRITE NEGATIVE  LEUKOCYTESUR NEGATIVE      Imaging: No results found.   Medications:   . albumin human 25 g (11/10/18 0503)   . aspirin EC  81 mg  Oral QODAY  . carvedilol  6.25 mg Oral BID WC  . hydrALAZINE  25 mg Oral Q8H  . insulin aspart  0-15 Units Subcutaneous TID WC  . insulin aspart  0-5 Units Subcutaneous QHS  . insulin glargine  8 Units Subcutaneous QHS  . insulin starter kit- syringes  1 kit Other Once  . living well with diabetes book   Does not apply Once  . sodium chloride flush  3 mL Intravenous Once  . sodium chloride flush  3 mL Intravenous Q12H   acetaminophen **OR** acetaminophen, albuterol, hydrALAZINE, ondansetron **OR** ondansetron (ZOFRAN) IV, polyethylene glycol  Assessment/ Plan:  Mr. Kevin Gibson is a 78 y.o. 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  Baseline creatinine of 2.2, GFR of 28 on 05/13/2018. 3.5 grams of proteinuria  at that time.  Chronic kidney disease secondary to diabetic nephropathy Acute renal failure could be progression of chronic kidney disease versus acute injury from acute cardiorenal syndrome versus vasculitis.  - Continue furosemide gtt - increase to 3m /hr. Continue albumin - Serologic work up for glomerulonephritis pending  2. Hypertension: well controlled. Home regimen of labetalol and amlodipine   3. Acute exacerbation of systolic congestive heart failure: EF 20-25%.   4. Diabetes mellitus type II with chronic kidney disease: poorly controlled. Hemoglobin A1c of 12.1%.  Insulin dependent.    LOS: 6 Elynor Kallenberger 6/21/20201:45 PM

## 2018-11-11 LAB — PROTEIN ELECTROPHORESIS, SERUM
A/G Ratio: 1 (ref 0.7–1.7)
Albumin ELP: 2.2 g/dL — ABNORMAL LOW (ref 2.9–4.4)
Alpha-1-Globulin: 0.3 g/dL (ref 0.0–0.4)
Alpha-2-Globulin: 0.7 g/dL (ref 0.4–1.0)
Beta Globulin: 0.6 g/dL — ABNORMAL LOW (ref 0.7–1.3)
Gamma Globulin: 0.7 g/dL (ref 0.4–1.8)
Globulin, Total: 2.3 g/dL (ref 2.2–3.9)
Total Protein ELP: 4.5 g/dL — ABNORMAL LOW (ref 6.0–8.5)

## 2018-11-11 LAB — PROTEIN ELECTRO, RANDOM URINE
Albumin ELP, Urine: 70.9 %
Alpha-1-Globulin, U: 0.7 %
Alpha-2-Globulin, U: 4.2 %
Beta Globulin, U: 11.4 %
Gamma Globulin, U: 12.8 %
Total Protein, Urine: 96.3 mg/dL

## 2018-11-11 LAB — BASIC METABOLIC PANEL
Anion gap: 9 (ref 5–15)
BUN: 58 mg/dL — ABNORMAL HIGH (ref 8–23)
CO2: 31 mmol/L (ref 22–32)
Calcium: 9.2 mg/dL (ref 8.9–10.3)
Chloride: 98 mmol/L (ref 98–111)
Creatinine, Ser: 2.69 mg/dL — ABNORMAL HIGH (ref 0.61–1.24)
GFR calc Af Amer: 26 mL/min — ABNORMAL LOW (ref >60)
GFR calc non Af Amer: 22 mL/min — ABNORMAL LOW (ref >60)
Glucose, Bld: 88 mg/dL (ref 70–99)
Potassium: 4 mmol/L (ref 3.5–5.1)
Sodium: 138 mmol/L (ref 135–145)

## 2018-11-11 LAB — GLUCOSE, CAPILLARY
Glucose-Capillary: 91 mg/dL (ref 70–99)
Glucose-Capillary: 99 mg/dL (ref 70–99)
Glucose-Capillary: 272 mg/dL — ABNORMAL HIGH (ref 70–99)
Glucose-Capillary: 208 mg/dL — ABNORMAL HIGH (ref 70–99)

## 2018-11-11 LAB — KAPPA/LAMBDA LIGHT CHAINS
Kappa free light chain: 80.1 mg/L — ABNORMAL HIGH (ref 3.3–19.4)
Kappa, lambda light chain ratio: 1.56 (ref 0.26–1.65)
Lambda free light chains: 51.3 mg/L — ABNORMAL HIGH (ref 5.7–26.3)

## 2018-11-11 LAB — GLOMERULAR BASEMENT MEMBRANE ANTIBODIES: GBM Ab: 3 units (ref 0–20)

## 2018-11-11 MED ORDER — OXYCODONE-ACETAMINOPHEN 5-325 MG PO TABS: 1.0000 | ORAL_TABLET | ORAL | Status: DC | PRN

## 2018-11-11 MED ORDER — INSULIN ASPART 100 UNIT/ML ~~LOC~~ SOLN
2.0000 [IU] | Freq: Three times a day (TID) | SUBCUTANEOUS | Status: DC
  Administered 2018-11-11: 2 [IU] via SUBCUTANEOUS
  Filled 2018-11-11: qty 1

## 2018-11-11 MED ORDER — SODIUM CHLORIDE 0.9 % IV SOLN
INTRAVENOUS | Status: DC | PRN
  Administered 2018-11-11 – 2018-11-12 (×2): 250 mL via INTRAVENOUS

## 2018-11-11 MED ORDER — INSULIN GLARGINE 100 UNIT/ML ~~LOC~~ SOLN
6.0000 [IU] | Freq: Every day | SUBCUTANEOUS | Status: DC
  Administered 2018-11-11: 6 [IU] via SUBCUTANEOUS
  Filled 2018-11-11: qty 0.06

## 2018-11-11 NOTE — Plan of Care (Signed)
Patient OOB to recliner for majority of the shift with 1 assist, tolerated well.   Problem: Activity: Goal: Capacity to carry out activities will improve Outcome: Progressing

## 2018-11-11 NOTE — Progress Notes (Addendum)
Inpatient Diabetes Program Recommendations  AACE/ADA: New Consensus Statement on Inpatient Glycemic Control (2015)  Target Ranges:  Prepandial:   less than 140 mg/dL      Peak postprandial:   less than 180 mg/dL (1-2 hours)      Critically ill patients:  140 - 180 mg/dL   Results for Kevin Gibson, Kevin Gibson (MRN [id number]) as of 11/11/2018 08:02  Ref. Range 11/09/2018 07:48 11/09/2018 11:41 11/09/2018 17:16 11/09/2018 20:52  Glucose-Capillary Latest Ref Range: 70 - 99 mg/dL 118 (H) 161 (H)  3 units NOVOLOG  247 (H)  5 units NOVOLOG  229 (H)  2 units NOVOLOG +  8 units LANTUS    Results for Kevin Gibson, Kevin Gibson (MRN [id number]) as of 11/11/2018 08:02  Ref. Range 11/10/2018 08:10 11/10/2018 11:48 11/10/2018 17:14 11/10/2018 21:41  Glucose-Capillary Latest Ref Range: 70 - 99 mg/dL 64 (L) 165 (H)  3 units NOVOLOG  284 (H)  8 units NOVOLOG  313 (H)  4 units NOVOLOG +  8 units LANTUS   Results for Kevin Gibson, Kevin Gibson (MRN [id number]) as of 11/11/2018 08:02  Ref. Range 11/11/2018 07:27  Glucose-Capillary Latest Ref Range: 70 - 99 mg/dL 91    Home DM Meds: None (stopped taking insulin due to cost)  Current Orders: Novolog Moderate Correction Scale/ SSI (0-15 units) TID AC + HS      Lantus 8 units QHS     MD- Please consider the following in-hospital insulin adjustments:  Hypoglycemic yesterday AM and CBG only 91 mg/dl this AM.  1. Please consider reducing Lantus to 6 units QHS  2. May also consider adding low dose Novolog Meal Coverage: Novolog 2 units TID with meals  (Please add the following Hold Parameters: Hold if pt eats <50% of meal, Hold if pt NPO)  3. Will likely need to be converted to more affordable insulin like 70/30 insulin BID at time of discharge--Per notes, pt stopped taking insulin b/c he couldn't afford it--70/30 Insulin can be purchased at [facility] for $25 per vial.     DM Coordinator spoke with patient on 6/16 and 6/17 regarding restart of insulin due to high A1C.    Based on A1C of 12.1%, he likely needs insulin, however he appears to be sensitive to insulin administration and doses? Need to avoid hypoglycemia at home.  Will need close f/u with PCP after discharge.     --Will follow patient during hospitalization--  Wyn Quaker RN, MSN, CDE Diabetes Coordinator Inpatient Glycemic Control Team Team Pager: [phone] (8a-5p)

## 2018-11-11 NOTE — Progress Notes (Signed)
[facility] [place] NAME: Kevin Gibson    MR#:  [id number]  DATE OF BIRTH:  [birth year]  SUBJECTIVE:    No new complaints Good UOP over 24 hours   REVIEW OF SYSTEMS:  CONSTITUTIONAL: No fever Positive for fatigue  and weakness.  EYES: No blurred or double vision.  EARS, NOSE, AND THROAT: No tinnitus or ear pain.  RESPIRATORY: No cough, shortness of breath, wheezing or hemoptysis.  CARDIOVASCULAR: No chest pain, orthopnea positive edema.  GASTROINTESTINAL: No nausea, vomiting, diarrhea or abdominal pain.  GENITOURINARY: No dysuria, hematuria.  ENDOCRINE: No polyuria, nocturia,  HEMATOLOGY: No anemia, easy bruising or bleeding SKIN: No rash or lesion. MUSCULOSKELETAL: No joint pain or arthritis.   NEUROLOGIC: No tingling, numbness, weakness.  PSYCHIATRY: No anxiety or depression.   DRUG ALLERGIES:  No Known Allergies  VITALS:  Blood pressure 114/64, pulse 66, temperature 98.2 F (36.8 C), temperature source Oral, resp. rate 17, height 5\' 7"  (1.702 m), weight 71.8 kg, SpO2 96 %.  PHYSICAL EXAMINATION:  GENERAL:  78 y.o.-year-old patient lying in the bed with no acute distress. Anasarca+ EYES: Pupils equal, round, reactive to light and accommodation. No scleral icterus. Extraocular muscles intact.  HEENT: Head atraumatic, normocephalic. Oropharynx and nasopharynx clear.  NECK:  Supple, no jugular venous distention. No thyroid enlargement, no tenderness.  LUNGS: Normal breath sounds bilaterally, no wheezing, rales,rhonchi or crepitation. No use of accessory muscles of respiration.  CARDIOVASCULAR: S1, S2 normal. No murmurs, rubs, or gallops.  ABDOMEN: Soft, nontender, nondistended. Bowel sounds present. EXTREMITIES: Significant pedal edema and left upper extremity pitting edema, 2+, no cyanosis, or clubbing.  NEUROLOGIC: Cranial nerves II through XII are intact. Muscle strength 5/5 in all extremities. Sensation intact. Gait  not checked. gen weakness PSYCHIATRIC: The patient is alert and oriented x 3.  SKIN: No obvious rash, lesion, or ulcer.    LABORATORY PANEL:   CBC Recent Labs  Lab 11/09/18 0439  WBC 5.6  HGB 9.6*  HCT 29.6*  PLT 143*   ------------------------------------------------------------------------------------------------------------------  Chemistries  Recent Labs  Lab 11/08/18 0411  11/11/18 0742  NA 137   < > 138  K 3.9   < > 4.0  CL 102   < > 98  CO2 28   < > 31  GLUCOSE 224*   < > 88  BUN 48*   < > 58*  CREATININE 2.62*   < > 2.69*  CALCIUM 8.3*   < > 9.2  AST 20  --   --   ALT 17  --   --   ALKPHOS 65  --   --   BILITOT 0.9  --   --    < > = values in this interval not displayed.   ------------------------------------------------------------------------------------------------------------------  Cardiac Enzymes Recent Labs  Lab 11/04/18 1148  TROPONINI 0.05*   ------------------------------------------------------------------------------------------------------------------  RADIOLOGY:  No results found.  EKG:   Orders placed or performed during the hospital encounter of 11/04/18  . EKG 12-Lead  . EKG 12-Lead  . ED EKG  . 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   Patient still has significant swelling  albumin level very low 2.2 started IV Lasix gtt with IV albumin -EF 20-25% -unable to give ACE/ARB due to rising creat -change to po coreg and ?spironolactone -d/c norvasc -start coreg and hydralazine  *AKI on CKD stage III.  Baseline  creatinine at 2 Baseline creatinine 2.1, creatinine 2.62--2.91-2.62 Nephrology consult with Dr. Juleen China appreciated On IV lasix gtt (June 20th)  *Diabetes mellitus.  Insulin-dependent.  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    -typically we don't treat superficial thrombophlebitis/occlusion. This was discussed with Dr. Juleen China. I agree with discontinuing eliquis given other several comorbidities and risk for G.I. bleed  *Essential hypertension Pt was on Norvasc and labetalol---given anasarca and severe cardiomyopathy now on coreg and hydralazine  *Severe hypo-hypoalbuminemia suspect due to nephrotic syndrome - per nephrology  Spoke with patient's daughter Emelia Loron on June 20th-- her voicemail is not set up. Ph Number [phone]  CODE STATUS: fc  TOTAL TIME TAKING CARE OF THIS PATIENT:25  minutes.   POSSIBLE D/C IN few DAYS, DEPENDING ON CLINICAL CONDITION.  Note: This dictation was prepared with Dragon dictation along with smaller phrase technology. Any transcriptional errors that result from this process are unintentional.   Fritzi Mandes M.D on 11/11/2018 at 10:08 AM  Between 7am to 6pm - Pager - [phone] After 6pm go to [email or web address] - password [email or web address] [facility]  [facility]  Office  [phone]  CC: Primary care physician; Kirk Ruths, MD

## 2018-11-11 NOTE — Progress Notes (Signed)
[facility]  ROUNDING NOTE   Subjective:   Furosemide gtt continue along with IV albumin infusion UOP 1675 yesterday Continues to have large amount of generalized edema  Objective:  Vital signs in last 24 hours:  Temp:  [98.1 F (36.7 C)-98.9 F (37.2 C)] 98.2 F (36.8 C) (06/22 0725) Pulse Rate:  [66-70] 66 (06/22 0725) Resp:  [16-19] 17 (06/22 0725) BP: (114-133)/(64-75) 114/64 (06/22 0725) SpO2:  [90 %-98 %] 96 % (06/22 0725) Weight:  [71.8 kg] 71.8 kg (06/22 0440)  Weight change: 0.136 kg Filed Weights   11/09/18 0356 11/10/18 0330 11/11/18 0440  Weight: 72.4 kg 71.6 kg 71.8 kg    Intake/Output: I/O last 3 completed shifts: In: 869.2 [P.O.:600; I.V.:99.3; IV Piggyback:169.9] Out: 2375 [Urine:2375]   Intake/Output this shift:  Total I/O In: 240 [P.O.:240] Out: 400 [Urine:400]  Physical Exam: General: NAD,   Head: Normocephalic, atraumatic. Moist oral mucosal membranes  Eyes: Anicteric,   Neck: Supple,   Lungs:   Mild bilateral crackles  Heart: Regular rate and rhythm  Abdomen:  +distended  Extremities:  ++ peripheral edema.  Neurologic: Nonfocal, moving all four extremities  Skin: No lesions    Condom catheter    Basic Metabolic Panel: Recent Labs  Lab 11/07/18 0437 11/08/18 0411 11/09/18 0439 11/10/18 0408 11/11/18 0742  NA 140 137 138 135 138  K 3.6 3.9 4.1 4.2 4.0  CL 104 102 101 98 98  CO2 25 28 29 29 31   GLUCOSE 149* 224* 86 127* 88  BUN 46* 48* 52* 59* 58*  CREATININE 2.49* 2.62* 2.82* 2.91* 2.69*  CALCIUM 8.3* 8.3* 8.3* 8.3* 9.2  PHOS  --   --   --  3.2  --     Liver Function Tests: Recent Labs  Lab 11/08/18 0411 11/10/18 0408  AST 20  --   ALT 17  --   ALKPHOS 65  --   BILITOT 0.9  --   PROT 4.7*  --   ALBUMIN 2.2* 3.0*   No results for input(s): LIPASE, AMYLASE in the last 168 hours. No results for input(s): AMMONIA in the last 168 hours.  CBC: Recent Labs  Lab 11/04/18 1148 11/05/18 0411 11/07/18 0437  11/08/18 0411 11/09/18 0439  WBC 7.7 5.6 4.9 5.5 5.6  HGB 13.6 11.6* 10.0* 9.6* 9.6*  HCT 40.8 35.4* 30.6* 29.4* 29.6*  MCV 89.3 91.9 92.7 92.5 91.6  PLT 212 166 146* 141* 143*    Cardiac Enzymes: Recent Labs  Lab 11/04/18 1148  TROPONINI 0.05*    BNP: Invalid input(s): POCBNP  CBG: Recent Labs  Lab 11/10/18 0810 11/10/18 1148 11/10/18 1714 11/10/18 2141 11/11/18 0727  GLUCAP 64* 165* 284* 313* 91    Microbiology: Results for orders placed or performed during the hospital encounter of 11/04/18  SARS Coronavirus 2 (CEPHEID - Performed in [place] hospital lab), Hosp Order     Status: None   Collection Time: 11/04/18  2:38 PM   Specimen: Nasopharyngeal Swab  Result Value Ref Range Status   SARS Coronavirus 2 NEGATIVE NEGATIVE Final    Comment: (NOTE) If result is NEGATIVE SARS-CoV-2 target nucleic acids are NOT DETECTED. The SARS-CoV-2 RNA is generally detectable in upper and lower  respiratory specimens during the acute phase of infection. The lowest  concentration of SARS-CoV-2 viral copies this assay can detect is 250  copies / mL. A negative result does not preclude SARS-CoV-2 infection  and should not be used as the sole basis for treatment  or other  patient management decisions.  A negative result may occur with  improper specimen collection / handling, submission of specimen other  than nasopharyngeal swab, presence of viral mutation(s) within the  areas targeted by this assay, and inadequate number of viral copies  (<250 copies / mL). A negative result must be combined with clinical  observations, patient history, and epidemiological information. If result is POSITIVE SARS-CoV-2 target nucleic acids are DETECTED. The SARS-CoV-2 RNA is generally detectable in upper and lower  respiratory specimens dur ing the acute phase of infection.  Positive  results are indicative of active infection with SARS-CoV-2.  Clinical  correlation with patient history and  other diagnostic information is  necessary to determine patient infection status.  Positive results do  not rule out bacterial infection or co-infection with other viruses. If result is PRESUMPTIVE POSTIVE SARS-CoV-2 nucleic acids MAY BE PRESENT.   A presumptive positive result was obtained on the submitted specimen  and confirmed on repeat testing.  While 2019 novel coronavirus  (SARS-CoV-2) nucleic acids may be present in the submitted sample  additional confirmatory testing may be necessary for epidemiological  and / or clinical management purposes  to differentiate between  SARS-CoV-2 and other Sarbecovirus currently known to infect humans.  If clinically indicated additional testing with an alternate test  methodology [phone]) is advised. The SARS-CoV-2 RNA is generally  detectable in upper and lower respiratory sp ecimens during the acute  phase of infection. The expected result is Negative. Fact Sheet for Patients:  StrictlyIdeas.no Fact Sheet for Healthcare Providers: BankingDealers.co.za This test is not yet approved or cleared by the [place] FDA and has been authorized for detection and/or diagnosis of SARS-CoV-2 by FDA under an Emergency Use Authorization (EUA).  This EUA will remain in effect (meaning this test can be used) for the duration of the COVID-19 declaration under Section 564(b)(1) of the Act, 21 U.S.C. section 360bbb-3(b)(1), unless the authorization is terminated or revoked sooner. Performed at [facility], [address] [place], [place] [address]     Coagulation Studies: No results for input(s): LABPROT, INR in the last 72 hours.  Urinalysis: No results for input(s): COLORURINE, LABSPEC, PHURINE, GLUCOSEU, HGBUR, BILIRUBINUR, KETONESUR, PROTEINUR, UROBILINOGEN, NITRITE, LEUKOCYTESUR in the last 72 hours.  Invalid input(s): APPERANCEUR    Imaging: No results found.   Medications:    . albumin human 25 g (11/11/18 0538)  . furosemide (LASIX) infusion 10 mg/hr (11/10/18 1841)   . aspirin EC  81 mg Oral QODAY  . carvedilol  6.25 mg Oral BID WC  . hydrALAZINE  25 mg Oral Q8H  . insulin aspart  0-15 Units Subcutaneous TID WC  . insulin aspart  0-5 Units Subcutaneous QHS  . insulin aspart  2 Units Subcutaneous TID WC  . insulin glargine  6 Units Subcutaneous QHS  . sodium chloride flush  3 mL Intravenous Once  . sodium chloride flush  3 mL Intravenous Q12H   acetaminophen **OR** acetaminophen, albuterol, hydrALAZINE, ondansetron **OR** ondansetron (ZOFRAN) IV, oxyCODONE-acetaminophen, polyethylene glycol  Assessment/ Plan:  Mr. LINVILLE DECAROLIS is a 78 y.o. 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  Baseline creatinine of 2.2, GFR of 28 on 05/13/2018. 3.5 grams of proteinuria at that time.  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  Serologies pending Acute renal failure could be progression of chronic kidney disease versus acute injury from acute cardiorenal syndrome versus vasculitis.  - Continue aggressive diuresis with furosemide gtt -currently at the rate of 10 mg/h along with IV albumin 25 g every 8 hours  2.  Generalized edema/volume overload and Acute exacerbation of systolic congestive heart failure: EF 20-25%.  -Discussed with patient that if medical management fails, we may have to consider hemodialysis  4. Diabetes mellitus type II with chronic kidney disease: poorly controlled. Lab Results  Component Value Date   HGBA1C 12.1 (H) 11/04/2018   Insulin dependent.   Patient was not taking proper doses of insulin due to difficulty in affording  medications.   LOS: 7 Kevin Gibson 6/22/202010:15 AM

## 2018-11-11 NOTE — Care Management Important Message (Signed)
Important Message  Patient Details  Name: Kevin Gibson MRN: [id number] Date of Birth: [birth year]   Medicare Important Message Given:  Yes     Dannette Barbara 11/11/2018, 11:32 AM

## 2018-11-12 ENCOUNTER — Encounter: Payer: Self-pay | Admitting: *Deleted

## 2018-11-12 LAB — GLUCOSE, CAPILLARY
Glucose-Capillary: 221 mg/dL — ABNORMAL HIGH (ref 70–99)
Glucose-Capillary: 127 mg/dL — ABNORMAL HIGH (ref 70–99)
Glucose-Capillary: 108 mg/dL — ABNORMAL HIGH (ref 70–99)
Glucose-Capillary: 53 mg/dL — ABNORMAL LOW (ref 70–99)
Glucose-Capillary: 162 mg/dL — ABNORMAL HIGH (ref 70–99)

## 2018-11-12 LAB — MPO/PR-3 (ANCA) ANTIBODIES
ANCA Proteinase 3: <3.5 U/mL (ref 0.0–3.5)
Myeloperoxidase Abs: <9 U/mL (ref 0.0–9.0)

## 2018-11-12 MED ORDER — INSULIN ASPART 100 UNIT/ML ~~LOC~~ SOLN
3.0000 [IU] | Freq: Three times a day (TID) | SUBCUTANEOUS | Status: DC
  Administered 2018-11-12 (×2): 3 [IU] via SUBCUTANEOUS
  Filled 2018-11-12 (×2): qty 1

## 2018-11-12 MED ORDER — ENSURE ENLIVE PO LIQD
237.0000 mL | Freq: Two times a day (BID) | ORAL | Status: DC
  Administered 2018-11-12 – 2018-11-14 (×5): 237 mL via ORAL

## 2018-11-12 MED ORDER — INSULIN GLARGINE 100 UNIT/ML ~~LOC~~ SOLN
6.0000 [IU] | Freq: Every day | SUBCUTANEOUS | Status: DC
  Administered 2018-11-12: 13:00:00 6 [IU] via SUBCUTANEOUS
  Filled 2018-11-12 (×2): qty 0.06

## 2018-11-12 MED ORDER — HEPARIN SODIUM (PORCINE) 5000 UNIT/ML IJ SOLN
5000.0000 [IU] | Freq: Three times a day (TID) | INTRAMUSCULAR | Status: DC
  Administered 2018-11-12 – 2018-11-15 (×9): 5000 [IU] via SUBCUTANEOUS
  Filled 2018-11-12 (×9): qty 1

## 2018-11-12 MED ORDER — OXYCODONE-ACETAMINOPHEN 5-325 MG PO TABS
1.0000 | ORAL_TABLET | Freq: Four times a day (QID) | ORAL | Status: DC | PRN
  Administered 2018-11-14: 1 via ORAL
  Filled 2018-11-12: qty 1

## 2018-11-12 MED ORDER — CARVEDILOL 12.5 MG PO TABS
12.5000 mg | ORAL_TABLET | Freq: Two times a day (BID) | ORAL | Status: DC
  Administered 2018-11-12 – 2018-11-15 (×7): 12.5 mg via ORAL
  Filled 2018-11-12 (×7): qty 1

## 2018-11-12 NOTE — Plan of Care (Signed)
Patient OOB to recliner with 1 assist, tolerated well. Remains on lasix drip, diuresing. Weaned to room air. O2 sats 95% on room air.   Problem: Clinical Measurements: Goal: Respiratory complications will improve Outcome: Progressing   Problem: Activity: Goal: Risk for activity intolerance will decrease Outcome: Progressing   Problem: Elimination: Goal: Will not experience complications related to urinary retention Outcome: Progressing   Problem: Safety: Goal: Ability to remain free from injury will improve Outcome: Progressing   Problem: Activity: Goal: Capacity to carry out activities will improve Outcome: Progressing

## 2018-11-12 NOTE — Progress Notes (Signed)
[facility]  ROUNDING NOTE   Subjective:   Furosemide gtt continue along with IV albumin infusion 06/22 0701 - 06/23 0700 In: 1288.3 [P.O.:360; I.V.:358.6; IV Piggyback:569.7] Out: 1941 [[id number]]  Continues to have large amount of generalized edema  Objective:  Vital signs in last 24 hours:  Temp:  [97.9 F (36.6 C)-99.2 F (37.3 C)] 98.2 F (36.8 C) (06/23 0800) Pulse Rate:  [64-75] 75 (06/23 0800) Resp:  [16-20] 18 (06/23 0800) BP: (133-144)/(68-74) 144/68 (06/23 0800) SpO2:  [95 %-100 %] 95 % (06/23 0800) Weight:  [67.7 kg] 67.7 kg (06/23 0430)  Weight change: -4.037 kg Filed Weights   11/10/18 0330 11/11/18 0440 11/12/18 0430  Weight: 71.6 kg 71.8 kg 67.7 kg    Intake/Output: I/O last 3 completed shifts: In: 1288.3 [P.O.:360; I.V.:358.6; IV Piggyback:569.7] Out: 8563 [Urine:3475]   Intake/Output this shift:  Total I/O In: 240 [P.O.:240] Out: 1450 [Urine:1450]  Physical Exam: General: NAD,   Head: Normocephalic, atraumatic. Moist oral mucosal membranes  Eyes: Anicteric,   Neck: Supple,   Lungs:   Mild bilateral crackles  Heart: Regular rate and rhythm  Abdomen:  +distended  Extremities:  ++ peripheral edema.  Neurologic: Nonfocal, moving all four extremities  Skin: No lesions    Condom catheter    Basic Metabolic Panel: Recent Labs  Lab 11/07/18 0437 11/08/18 0411 11/09/18 0439 11/10/18 0408 11/11/18 0742  NA 140 137 138 135 138  K 3.6 3.9 4.1 4.2 4.0  CL 104 102 101 98 98  CO2 25 28 29 29 31   GLUCOSE 149* 224* 86 127* 88  BUN 46* 48* 52* 59* 58*  CREATININE 2.49* 2.62* 2.82* 2.91* 2.69*  CALCIUM 8.3* 8.3* 8.3* 8.3* 9.2  PHOS  --   --   --  3.2  --     Liver Function Tests: Recent Labs  Lab 11/08/18 0411 11/10/18 0408  AST 20  --   ALT 17  --   ALKPHOS 65  --   BILITOT 0.9  --   PROT 4.7*  --   ALBUMIN 2.2* 3.0*   No results for input(s): LIPASE, AMYLASE in the last 168 hours. No results for input(s): AMMONIA in  the last 168 hours.  CBC: Recent Labs  Lab 11/07/18 0437 11/08/18 0411 11/09/18 0439  WBC 4.9 5.5 5.6  HGB 10.0* 9.6* 9.6*  HCT 30.6* 29.4* 29.6*  MCV 92.7 92.5 91.6  PLT 146* 141* 143*    Cardiac Enzymes: No results for input(s): CKTOTAL, CKMB, CKMBINDEX, TROPONINI in the last 168 hours.  BNP: Invalid input(s): POCBNP  CBG: Recent Labs  Lab 11/11/18 1131 11/11/18 1620 11/11/18 2135 11/12/18 0757 11/12/18 1210  GLUCAP 99 272* 208* 221* 127*    Microbiology: Results for orders placed or performed during the hospital encounter of 11/04/18  SARS Coronavirus 2 (CEPHEID - Performed in [place] hospital lab), Hosp Order     Status: None   Collection Time: 11/04/18  2:38 PM   Specimen: Nasopharyngeal Swab  Result Value Ref Range Status   SARS Coronavirus 2 NEGATIVE NEGATIVE Final    Comment: (NOTE) If result is NEGATIVE SARS-CoV-2 target nucleic acids are NOT DETECTED. The SARS-CoV-2 RNA is generally detectable in upper and lower  respiratory specimens during the acute phase of infection. The lowest  concentration of SARS-CoV-2 viral copies this assay can detect is 250  copies / mL. A negative result does not preclude SARS-CoV-2 infection  and should not be used as the sole basis for treatment  or other  patient management decisions.  A negative result may occur with  improper specimen collection / handling, submission of specimen other  than nasopharyngeal swab, presence of viral mutation(s) within the  areas targeted by this assay, and inadequate number of viral copies  (<250 copies / mL). A negative result must be combined with clinical  observations, patient history, and epidemiological information. If result is POSITIVE SARS-CoV-2 target nucleic acids are DETECTED. The SARS-CoV-2 RNA is generally detectable in upper and lower  respiratory specimens dur ing the acute phase of infection.  Positive  results are indicative of active infection with SARS-CoV-2.   Clinical  correlation with patient history and other diagnostic information is  necessary to determine patient infection status.  Positive results do  not rule out bacterial infection or co-infection with other viruses. If result is PRESUMPTIVE POSTIVE SARS-CoV-2 nucleic acids MAY BE PRESENT.   A presumptive positive result was obtained on the submitted specimen  and confirmed on repeat testing.  While 2019 novel coronavirus  (SARS-CoV-2) nucleic acids may be present in the submitted sample  additional confirmatory testing may be necessary for epidemiological  and / or clinical management purposes  to differentiate between  SARS-CoV-2 and other Sarbecovirus currently known to infect humans.  If clinically indicated additional testing with an alternate test  methodology [phone]) is advised. The SARS-CoV-2 RNA is generally  detectable in upper and lower respiratory sp ecimens during the acute  phase of infection. The expected result is Negative. Fact Sheet for Patients:  StrictlyIdeas.no Fact Sheet for Healthcare Providers: BankingDealers.co.za This test is not yet approved or cleared by the [place] FDA and has been authorized for detection and/or diagnosis of SARS-CoV-2 by FDA under an Emergency Use Authorization (EUA).  This EUA will remain in effect (meaning this test can be used) for the duration of the COVID-19 declaration under Section 564(b)(1) of the Act, 21 U.S.C. section 360bbb-3(b)(1), unless the authorization is terminated or revoked sooner. Performed at [facility], [address] [place], [place] [address]     Coagulation Studies: No results for input(s): LABPROT, INR in the last 72 hours.  Urinalysis: No results for input(s): COLORURINE, LABSPEC, PHURINE, GLUCOSEU, HGBUR, BILIRUBINUR, KETONESUR, PROTEINUR, UROBILINOGEN, NITRITE, LEUKOCYTESUR in the last 72 hours.  Invalid input(s): APPERANCEUR     Imaging: No results found.   Medications:   . furosemide (LASIX) infusion 10 mg/hr (11/12/18 0558)   . carvedilol  12.5 mg Oral BID WC  . feeding supplement (ENSURE ENLIVE)  237 mL Oral BID BM  . heparin injection (subcutaneous)  5,000 Units Subcutaneous Q8H  . hydrALAZINE  25 mg Oral Q8H  . insulin aspart  0-15 Units Subcutaneous TID WC  . insulin aspart  0-5 Units Subcutaneous QHS  . insulin aspart  3 Units Subcutaneous TID WC  . insulin glargine  6 Units Subcutaneous Daily  . sodium chloride flush  3 mL Intravenous Once  . sodium chloride flush  3 mL Intravenous Q12H   acetaminophen **OR** acetaminophen, albuterol, hydrALAZINE, ondansetron **OR** ondansetron (ZOFRAN) IV, oxyCODONE-acetaminophen, polyethylene glycol  Assessment/ Plan:  Mr. Kevin Gibson is a 78 y.o. 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  Baseline creatinine of 2.2, GFR of 28 on 05/13/2018. 3.5 grams of proteinuria  at that time.  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  Serologies neg so far Acute renal failure could be progression of chronic kidney disease versus acute injury from acute cardiorenal syndrome  - Continue aggressive diuresis with furosemide gtt -currently at the rate of 10 mg/h along with IV albumin 25 g every 8 hours- Good response  2.  Generalized edema/volume overload and Acute exacerbation of systolic congestive heart failure: EF 20-25%.  -Discussed with patient that if medical management fails, we may have to consider hemodialysis  4. Diabetes mellitus type II with chronic kidney disease: poorly controlled. Lab Results  Component Value Date   HGBA1C 12.1 (H)  11/04/2018   Insulin dependent.   Patient was not taking proper doses of insulin due to difficulty in affording medications.   LOS: [place] 6/23/20202:44 PM

## 2018-11-12 NOTE — Progress Notes (Signed)
[place] [facility] [place] NAME: Kevin Gibson    MR#:  [id number]  DATE OF BIRTH:  [birth year]  SUBJECTIVE:    No new complaints Good UOP over 24 hours Looks better overall   REVIEW OF SYSTEMS:  CONSTITUTIONAL: No fever Positive for fatigue  and weakness.  EYES: No blurred or double vision.  EARS, NOSE, AND THROAT: No tinnitus or ear pain.  RESPIRATORY: No cough, shortness of breath, wheezing or hemoptysis.  CARDIOVASCULAR: No chest pain, orthopnea positive edema.  GASTROINTESTINAL: No nausea, vomiting, diarrhea or abdominal pain.  GENITOURINARY: No dysuria, hematuria.  ENDOCRINE: No polyuria, nocturia,  HEMATOLOGY: No anemia, easy bruising or bleeding SKIN: No rash or lesion. MUSCULOSKELETAL: No joint pain or arthritis.   NEUROLOGIC: No tingling, numbness, weakness.  PSYCHIATRY: No anxiety or depression.   DRUG ALLERGIES:  No Known Allergies  VITALS:  Blood pressure (!) 144/68, pulse 75, temperature 98.2 F (36.8 C), temperature source Oral, resp. rate 18, height 5\' 7"  (1.702 m), weight 67.7 kg, SpO2 95 %.  PHYSICAL EXAMINATION:  GENERAL:  78 y.o.-year-old patient lying in the bed with no acute distress. Anasarca+ EYES: Pupils equal, round, reactive to light and accommodation. No scleral icterus. Extraocular muscles intact.  HEENT: Head atraumatic, normocephalic. Oropharynx and nasopharynx clear.  NECK:  Supple, no jugular venous distention. No thyroid enlargement, no tenderness.  LUNGS: Normal breath sounds bilaterally, no wheezing, rales,rhonchi or crepitation. No use of accessory muscles of respiration.  CARDIOVASCULAR: S1, S2 normal. No murmurs, rubs, or gallops.  ABDOMEN: Soft, nontender, nondistended. Bowel sounds present. EXTREMITIES: Significant pedal edema and left upper extremity pitting edema, 2+, no cyanosis, or clubbing.  NEUROLOGIC: Cranial nerves II through XII are intact. Muscle strength 5/5 in all  extremities. Sensation intact. Gait not checked. gen weakness PSYCHIATRIC: The patient is alert and oriented x 3.  SKIN: No obvious rash, lesion, or ulcer.    LABORATORY PANEL:   CBC Recent Labs  Lab 11/09/18 0439  WBC 5.6  HGB 9.6*  HCT 29.6*  PLT 143*   ------------------------------------------------------------------------------------------------------------------  Chemistries  Recent Labs  Lab 11/08/18 0411  11/11/18 0742  NA 137   < > 138  K 3.9   < > 4.0  CL 102   < > 98  CO2 28   < > 31  GLUCOSE 224*   < > 88  BUN 48*   < > 58*  CREATININE 2.62*   < > 2.69*  CALCIUM 8.3*   < > 9.2  AST 20  --   --   ALT 17  --   --   ALKPHOS 65  --   --   BILITOT 0.9  --   --    < > = values in this interval not displayed.   ------------------------------------------------------------------------------------------------------------------  Cardiac Enzymes No results for input(s): TROPONINI in the last 168 hours. ------------------------------------------------------------------------------------------------------------------  RADIOLOGY:  No results found.  EKG:   Orders placed or performed during the hospital encounter of 11/04/18  . EKG 12-Lead  . EKG 12-Lead  . ED EKG  . 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   Patient still has significant swelling  albumin level very low 2.2 started IV Lasix gtt with IV albumin -EF 20-25% -unable to give ACE/ARB due to rising creat -d/c norvasc -started coreg and hydralazine  *AKI on CKD stage III.  Baseline creatinine at 2  Baseline creatinine 2.1, creatinine 2.62--2.91-2.62 Nephrology consult with Dr. Juleen Gibson appreciated On IV lasix gtt (June 20th) -total uop >10 liters  *Diabetes mellitus.  Insulin-dependent.  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  -typically we don't treat superficial thrombophlebitis/occlusion. This was discussed with Dr. Juleen Gibson. I agree with discontinuing eliquis given other several comorbidities and risk for G.I. bleed  *Essential hypertension Pt was on Norvasc and labetalol---given anasarca and severe cardiomyopathy now on coreg and hydralazine  *Severe hypo-hypoalbuminemia suspect due to nephrotic syndrome - per nephrology  Spoke with patient's daughter Kevin Gibson on June 23rd left VM. Ph Number [phone]  CODE STATUS: fc  TOTAL TIME TAKING CARE OF THIS PATIENT:25  minutes.   POSSIBLE D/C IN few DAYS, DEPENDING ON CLINICAL CONDITION.  Note: This dictation was prepared with Dragon dictation along with smaller phrase technology. Any transcriptional errors that result from this process are unintentional.   Kevin Gibson M.D on 11/12/2018 at 8:27 AM  Between 7am to 6pm - Pager - [phone] After 6pm go to [email or web address] - password [email or web address] [facility]  [facility]  Office  [phone]  CC: Primary care physician; Kevin Ruths, MD

## 2018-11-12 NOTE — Plan of Care (Signed)
  Problem: Nutrition: Goal: Adequate nutrition will be maintained Outcome: Progressing   Problem: Coping: Goal: Level of anxiety will decrease Outcome: Progressing   Problem: Elimination: Goal: Will not experience complications related to urinary retention Outcome: Progressing Note: Lasix gtt with good output   Problem: Pain Managment: Goal: General experience of comfort will improve Outcome: Progressing Note: No complaints of pain this shift   Problem: Safety: Goal: Ability to remain free from injury will improve Outcome: Progressing   Problem: Skin Integrity: Goal: Risk for impaired skin integrity will decrease Outcome: Progressing

## 2018-11-13 LAB — BASIC METABOLIC PANEL
Anion gap: 11 (ref 5–15)
BUN: 64 mg/dL — ABNORMAL HIGH (ref 8–23)
CO2: 34 mmol/L — ABNORMAL HIGH (ref 22–32)
Calcium: 9.6 mg/dL (ref 8.9–10.3)
Chloride: 92 mmol/L — ABNORMAL LOW (ref 98–111)
Creatinine, Ser: 2.44 mg/dL — ABNORMAL HIGH (ref 0.61–1.24)
GFR calc Af Amer: 29 mL/min — ABNORMAL LOW (ref >60)
GFR calc non Af Amer: 25 mL/min — ABNORMAL LOW (ref >60)
Glucose, Bld: 256 mg/dL — ABNORMAL HIGH (ref 70–99)
Potassium: 3.7 mmol/L (ref 3.5–5.1)
Sodium: 137 mmol/L (ref 135–145)

## 2018-11-13 LAB — GLUCOSE, CAPILLARY
Glucose-Capillary: 232 mg/dL — ABNORMAL HIGH (ref 70–99)
Glucose-Capillary: 269 mg/dL — ABNORMAL HIGH (ref 70–99)
Glucose-Capillary: 240 mg/dL — ABNORMAL HIGH (ref 70–99)
Glucose-Capillary: 217 mg/dL — ABNORMAL HIGH (ref 70–99)

## 2018-11-13 MED ORDER — INSULIN ASPART PROT & ASPART (70-30 MIX) 100 UNIT/ML ~~LOC~~ SUSP
6.0000 [IU] | Freq: Two times a day (BID) | SUBCUTANEOUS | Status: DC
  Administered 2018-11-13 (×2): 6 [IU] via SUBCUTANEOUS
  Filled 2018-11-13 (×2): qty 10

## 2018-11-13 NOTE — Progress Notes (Addendum)
Inpatient Diabetes Program Recommendations  AACE/ADA: New Consensus Statement on Inpatient Glycemic Control   Target Ranges:  Prepandial:   less than 140 mg/dL      Peak postprandial:   less than 180 mg/dL (1-2 hours)      Critically ill patients:  140 - 180 mg/dL   Results for LANIER, MILLON (MRN [id number]) as of 11/13/2018 08:40  Ref. Range 11/12/2018 07:57 11/12/2018 12:10 11/12/2018 16:46 11/12/2018 17:08 11/12/2018 21:43 11/13/2018 07:26  Glucose-Capillary Latest Ref Range: 70 - 99 mg/dL 221 (H)  Novolog 8 units 127 (H)  Novolog 5 units  Lantus 6 units 53 (L) 108 (H) 162 (H) 232 (H)   Review of Glycemic Control  Diabetes history: DM2 Outpatient Diabetes medications: None; stopped taking due to cost Current orders for Inpatient glycemic control: Lantus 6 units daily, Novolog 3 units TID with meals, Novolog 0-15 units TID with meals, Novolog 0-5 units QHS  Inpatient Diabetes Program Recommendations:   Insulin - Basal: Since patient will need an affordable insulin, may want to consider switching from Lantus and Novolog meal coverage to 70/30 6 units BID (will provide a total of 8.4 units for basal and 3.6 units for meal coverage per day). If 70/30 is started, please discontinue Lantus and Novolog 3 units TID with meals for meal coverage.  Addendum 11/13/18@12 :23-Talked with patient over the phone regarding 70/30 insulin. Patient states that his copay for 70/30 in the past was $35. Informed patient that Novolin 70/30 can be purchased at [facility] for $25 per vial. Patient reports that he will try to pay for his insulin but notes he will have to "skimp on this and that to be able to afford the insulin".  Inquired about wether patient has ever applied for assistance such as Medicaid and patient reports that he has not. Encouraged patient to apply for Medicaid (in addition to his Medicare) to see if he can get approved which would decrease his copay of medications to $4. Also encouraged patient to  call 1-800 number on back on Medicare card to see if he qualifies for any medication assistance through Medicare.  Patient states that he will plan to call the 1-800 number and then apply for Medicaid if needed. Noted NOVOLIN 70/30 is a Level 2 with current insurance and patient states he thinks his copay would be $25. Patient states he will do his best to get the insulin. Patient verbalized understanding of information discussed and states that he has no further questions at this time.  Thanks, Barnie Alderman, RN, MSN, CDE Diabetes Coordinator Inpatient Diabetes Program [phone] (Team Pager from 8am to 5pm)

## 2018-11-13 NOTE — Progress Notes (Signed)
PT Cancellation Note  Patient Details Name: Kevin Gibson MRN: [id number] DOB: June 15, 1940   Cancelled Treatment:    Reason Eval/Treat Not Completed: Other (comment)   Pt in bed.  Stating he had sat up in the chair today and walked to the bathroom.  Stated his L Leg is aching some and he is "nursing it" and does not want to get out of bed or move further today.  Will continue as appropriate.    Chesley Noon 11/13/2018, 2:43 PM

## 2018-11-13 NOTE — Progress Notes (Signed)
[place] at [place] NAME: Kevin Gibson    MR#:  [id number]  DATE OF BIRTH:  April 29, 1941  SUBJECTIVE:    No new complaints Good UOP over 24 hours Looks better overall He is wanting to go home soon!  REVIEW OF SYSTEMS:  CONSTITUTIONAL: No fever Positive for fatigue  and weakness.  EYES: No blurred or double vision.  EARS, NOSE, AND THROAT: No tinnitus or ear pain.  RESPIRATORY: No cough, shortness of breath, wheezing or hemoptysis.  CARDIOVASCULAR: No chest pain, orthopnea positive edema.  GASTROINTESTINAL: No nausea, vomiting, diarrhea or abdominal pain.  GENITOURINARY: No dysuria, hematuria.  ENDOCRINE: No polyuria, nocturia,  HEMATOLOGY: No anemia, easy bruising or bleeding SKIN: No rash or lesion. MUSCULOSKELETAL: No joint pain or arthritis.   NEUROLOGIC: No tingling, numbness, +weakness.  PSYCHIATRY: No anxiety or depression.   DRUG ALLERGIES:  No Known Allergies  VITALS:  Blood pressure (!) 154/91, pulse 79, temperature 98.7 F (37.1 C), temperature source Oral, resp. rate 19, height 5\' 7"  (1.702 m), weight 66 kg, SpO2 91 %.  PHYSICAL EXAMINATION:  GENERAL:  78 y.o.-year-old patient lying in the bed with no acute distress. Anasarca+ EYES: Pupils equal, round, reactive to light and accommodation. No scleral icterus. Extraocular muscles intact.  HEENT: Head atraumatic, normocephalic. Oropharynx and nasopharynx clear.  NECK:  Supple, no jugular venous distention. No thyroid enlargement, no tenderness.  LUNGS: Normal breath sounds bilaterally, no wheezing, rales,rhonchi or crepitation. No use of accessory muscles of respiration.  CARDIOVASCULAR: S1, S2 normal. No murmurs, rubs, or gallops.  ABDOMEN: Soft, nontender, nondistended. Bowel sounds present. EXTREMITIES: Significant pedal edema and left upper extremity pitting edema, 2+, no cyanosis, or clubbing.  NEUROLOGIC: Cranial nerves II through XII are intact. Muscle  strength 5/5 in all extremities. Sensation intact. Gait not checked. gen weakness PSYCHIATRIC: The patient is alert and oriented x 3.  SKIN: No obvious rash, lesion, or ulcer.    LABORATORY PANEL:   CBC Recent Labs  Lab 11/09/18 0439  WBC 5.6  HGB 9.6*  HCT 29.6*  PLT 143*   ------------------------------------------------------------------------------------------------------------------  Chemistries  Recent Labs  Lab 11/08/18 0411  11/13/18 0709  NA 137   < > 137  K 3.9   < > 3.7  CL 102   < > 92*  CO2 28   < > 34*  GLUCOSE 224*   < > 256*  BUN 48*   < > 64*  CREATININE 2.62*   < > 2.44*  CALCIUM 8.3*   < > 9.6  AST 20  --   --   ALT 17  --   --   ALKPHOS 65  --   --   BILITOT 0.9  --   --    < > = values in this interval not displayed.   ------------------------------------------------------------------------------------------------------------------  Cardiac Enzymes No results for input(s): TROPONINI in the last 168 hours. ------------------------------------------------------------------------------------------------------------------  RADIOLOGY:  No results found.  EKG:   Orders placed or performed during the hospital encounter of 11/04/18  . EKG 12-Lead  . EKG 12-Lead  . ED EKG  . 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   Patient still has significant swelling  albumin level very low 2.2 started IV Lasix gtt with IV albumin -EF 20-25% -unable to give ACE/ARB due to rising creat -d/c norvasc -started coreg and hydralazine  *AKI on CKD stage  III.  Baseline creatinine at 2 Baseline creatinine 2.1, creatinine 2.62--2.91-2.62-2.44 Nephrology consult with Dr. Juleen China appreciated On IV lasix gtt (June 20th) -total uop >13 liters  *Diabetes mellitus.  Insulin-dependent.  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  -typically we don't treat superficial thrombophlebitis/occlusion. This was discussed with Dr. Juleen China. I agree with discontinuing eliquis given other several comorbidities and risk for G.I. bleed  *Essential hypertension Pt was on Norvasc and labetalol---given anasarca and severe cardiomyopathy now on coreg and hydralazine  *Severe hypo-hypoalbuminemia suspect due to nephrotic syndrome - per nephrology  patient's contact is daughter Emelia Loron on June 23rd left VM. Ph Number [phone]  CODE STATUS: fc  TOTAL TIME TAKING CARE OF THIS PATIENT:25  minutes.   POSSIBLE D/C IN few DAYS, DEPENDING ON CLINICAL CONDITION.  Note: This dictation was prepared with Dragon dictation along with smaller phrase technology. Any transcriptional errors that result from this process are unintentional.   Fritzi Mandes M.D on 11/13/2018 at 1:43 PM  Between 7am to 6pm - Pager - [phone] After 6pm go to [email or web address] - password [email or web address] [facility]  [facility]  Office  [phone]  CC: Primary care physician; Kirk Ruths, MD

## 2018-11-13 NOTE — Progress Notes (Signed)
Nutrition Brief Note  Patient identified for LOS  78 y.o. 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 Will hold off on any supplements for now as pt with nephrotic syndrome and CKD IV. Per chart, pt is weight stable pta.   Wt Readings from Last 15 Encounters:  11/13/18 66 kg  01/23/17 63.3 kg  12/28/15 71.7 kg  12/15/14 72.8 kg  03/03/14 74.8 kg  11/26/13 72.1 kg  11/25/13 74.2 kg    Body mass index is 22.77 kg/m. Patient meets criteria for normal weight based on current BMI.   Current diet order is HH, patient is consuming approximately 100% of meals at this time. Labs and medications reviewed.   No nutrition interventions warranted at this time. If nutrition issues arise, please consult RD.   Koleen Distance MS, RD, LDN Pager #- [phone] Office#- [phone] After Hours Pager: [phone]

## 2018-11-13 NOTE — Progress Notes (Signed)
[facility]  ROUNDING NOTE   Subjective:   Furosemide gtt continue along with IV albumin infusion 06/23 0701 - 06/24 0700 In: 240 [P.O.:240] Out: 0092 [Urine:4125]  Continues to have large amount of generalized edema, but it appears to be improving States he is ambulatory and has been up to the chair  Objective:  Vital signs in last 24 hours:  Temp:  [97.7 F (36.5 C)-98.7 F (37.1 C)] 98.7 F (37.1 C) (06/24 0726) Pulse Rate:  [71-80] 79 (06/24 0726) Resp:  [16-19] 19 (06/24 0726) BP: (145-154)/(67-91) 154/91 (06/24 0726) SpO2:  [91 %-95 %] 91 % (06/24 0726) Weight:  [66 kg] 66 kg (06/24 0437)  Weight change: -1.769 kg Filed Weights   11/11/18 0440 11/12/18 0430 11/13/18 0437  Weight: 71.8 kg 67.7 kg 66 kg    Intake/Output: I/O last 3 completed shifts: In: 1168.3 [P.O.:240; I.V.:358.6; IV Piggyback:569.7] Out: 5900 [Urine:5900]   Intake/Output this shift:  Total I/O In: 240 [P.O.:240] Out: 1150 [Urine:1150]  Physical Exam: General: NAD,   Head: Normocephalic, atraumatic. Moist oral mucosal membranes  Eyes: Anicteric,   Neck: Supple,   Lungs:   Mild bilateral crackles  Heart: Regular rate and rhythm  Abdomen:  +distended  Extremities:  ++ peripheral edema.  Neurologic: Nonfocal, moving all four extremities  Skin: No lesions    Condom catheter    Basic Metabolic Panel: Recent Labs  Lab 11/08/18 0411 11/09/18 0439 11/10/18 0408 11/11/18 0742 11/13/18 0709  NA 137 138 135 138 137  K 3.9 4.1 4.2 4.0 3.7  CL 102 101 98 98 92*  CO2 28 29 29 31  34*  GLUCOSE 224* 86 127* 88 256*  BUN 48* 52* 59* 58* 64*  CREATININE 2.62* 2.82* 2.91* 2.69* 2.44*  CALCIUM 8.3* 8.3* 8.3* 9.2 9.6  PHOS  --   --  3.2  --   --     Liver Function Tests: Recent Labs  Lab 11/08/18 0411 11/10/18 0408  AST 20  --   ALT 17  --   ALKPHOS 65  --   BILITOT 0.9  --   PROT 4.7*  --   ALBUMIN 2.2* 3.0*   No results for input(s): LIPASE, AMYLASE in the last 168  hours. No results for input(s): AMMONIA in the last 168 hours.  CBC: Recent Labs  Lab 11/07/18 0437 11/08/18 0411 11/09/18 0439  WBC 4.9 5.5 5.6  HGB 10.0* 9.6* 9.6*  HCT 30.6* 29.4* 29.6*  MCV 92.7 92.5 91.6  PLT 146* 141* 143*    Cardiac Enzymes: No results for input(s): CKTOTAL, CKMB, CKMBINDEX, TROPONINI in the last 168 hours.  BNP: Invalid input(s): POCBNP  CBG: Recent Labs  Lab 11/12/18 1646 11/12/18 1708 11/12/18 2143 11/13/18 0726 11/13/18 1126  GLUCAP 53* 108* 162* 232* 269*    Microbiology: Results for orders placed or performed during the hospital encounter of 11/04/18  SARS Coronavirus 2 (CEPHEID - Performed in [place] hospital lab), Hosp Order     Status: None   Collection Time: 11/04/18  2:38 PM   Specimen: Nasopharyngeal Swab  Result Value Ref Range Status   SARS Coronavirus 2 NEGATIVE NEGATIVE Final    Comment: (NOTE) If result is NEGATIVE SARS-CoV-2 target nucleic acids are NOT DETECTED. The SARS-CoV-2 RNA is generally detectable in upper and lower  respiratory specimens during the acute phase of infection. The lowest  concentration of SARS-CoV-2 viral copies this assay can detect is 250  copies / mL. A negative result does not preclude  SARS-CoV-2 infection  and should not be used as the sole basis for treatment or other  patient management decisions.  A negative result may occur with  improper specimen collection / handling, submission of specimen other  than nasopharyngeal swab, presence of viral mutation(s) within the  areas targeted by this assay, and inadequate number of viral copies  (<250 copies / mL). A negative result must be combined with clinical  observations, patient history, and epidemiological information. If result is POSITIVE SARS-CoV-2 target nucleic acids are DETECTED. The SARS-CoV-2 RNA is generally detectable in upper and lower  respiratory specimens dur ing the acute phase of infection.  Positive  results are  indicative of active infection with SARS-CoV-2.  Clinical  correlation with patient history and other diagnostic information is  necessary to determine patient infection status.  Positive results do  not rule out bacterial infection or co-infection with other viruses. If result is PRESUMPTIVE POSTIVE SARS-CoV-2 nucleic acids MAY BE PRESENT.   A presumptive positive result was obtained on the submitted specimen  and confirmed on repeat testing.  While 2019 novel coronavirus  (SARS-CoV-2) nucleic acids may be present in the submitted sample  additional confirmatory testing may be necessary for epidemiological  and / or clinical management purposes  to differentiate between  SARS-CoV-2 and other Sarbecovirus currently known to infect humans.  If clinically indicated additional testing with an alternate test  methodology [phone]) is advised. The SARS-CoV-2 RNA is generally  detectable in upper and lower respiratory sp ecimens during the acute  phase of infection. The expected result is Negative. Fact Sheet for Patients:  StrictlyIdeas.no Fact Sheet for Healthcare Providers: BankingDealers.co.za This test is not yet approved or cleared by the [place] FDA and has been authorized for detection and/or diagnosis of SARS-CoV-2 by FDA under an Emergency Use Authorization (EUA).  This EUA will remain in effect (meaning this test can be used) for the duration of the COVID-19 declaration under Section 564(b)(1) of the Act, 21 U.S.C. section 360bbb-3(b)(1), unless the authorization is terminated or revoked sooner. Performed at [facility], [address] [place], [place] [address]     Coagulation Studies: No results for input(s): LABPROT, INR in the last 72 hours.  Urinalysis: No results for input(s): COLORURINE, LABSPEC, PHURINE, GLUCOSEU, HGBUR, BILIRUBINUR, KETONESUR, PROTEINUR, UROBILINOGEN, NITRITE, LEUKOCYTESUR in the last  72 hours.  Invalid input(s): APPERANCEUR    Imaging: No results found.   Medications:   . furosemide (LASIX) infusion 10 mg/hr (11/13/18 0213)   . carvedilol  12.5 mg Oral BID WC  . feeding supplement (ENSURE ENLIVE)  237 mL Oral BID BM  . heparin injection (subcutaneous)  5,000 Units Subcutaneous Q8H  . hydrALAZINE  25 mg Oral Q8H  . insulin aspart  0-15 Units Subcutaneous TID WC  . insulin aspart  0-5 Units Subcutaneous QHS  . insulin aspart protamine- aspart  6 Units Subcutaneous BID WC  . sodium chloride flush  3 mL Intravenous Once  . sodium chloride flush  3 mL Intravenous Q12H   acetaminophen **OR** acetaminophen, albuterol, hydrALAZINE, ondansetron **OR** ondansetron (ZOFRAN) IV, oxyCODONE-acetaminophen, polyethylene glycol  Assessment/ Plan:  Mr. LON KLIPPEL is a 78 y.o. 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  Baseline creatinine of 2.2, GFR of  28 on 05/13/2018. 3.5 grams of proteinuria at that time.  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  Serologies neg so far Acute renal failure could be progression of chronic kidney disease versus acute injury from acute cardiorenal syndrome  - Continue aggressive diuresis with furosemide gtt -currently at the rate of 10 mg/h along with IV albumin 25 g every 8 hours- Good response  2.  Generalized edema/volume overload and Acute exacerbation of systolic congestive heart failure: EF 20-25%.  -Discussed with patient that if medical management fails, we may have to consider hemodialysis  3. Diabetes mellitus type II with chronic kidney disease: poorly controlled. Lab Results  Component Value Date   HGBA1C 12.1  (H) 11/04/2018   Insulin dependent.   Patient was not taking proper doses of insulin due to difficulty in affording medications.   LOS: 9 Gwynevere Lizana 6/24/20204:06 PM

## 2018-11-14 LAB — BASIC METABOLIC PANEL
Anion gap: 12 (ref 5–15)
BUN: 64 mg/dL — ABNORMAL HIGH (ref 8–23)
CO2: 35 mmol/L — ABNORMAL HIGH (ref 22–32)
Calcium: 9.8 mg/dL (ref 8.9–10.3)
Chloride: 90 mmol/L — ABNORMAL LOW (ref 98–111)
Creatinine, Ser: 2.26 mg/dL — ABNORMAL HIGH (ref 0.61–1.24)
GFR calc Af Amer: 31 mL/min — ABNORMAL LOW (ref >60)
GFR calc non Af Amer: 27 mL/min — ABNORMAL LOW (ref >60)
Glucose, Bld: 251 mg/dL — ABNORMAL HIGH (ref 70–99)
Potassium: 3.9 mmol/L (ref 3.5–5.1)
Sodium: 137 mmol/L (ref 135–145)

## 2018-11-14 LAB — GLUCOSE, CAPILLARY
Glucose-Capillary: 226 mg/dL — ABNORMAL HIGH (ref 70–99)
Glucose-Capillary: 235 mg/dL — ABNORMAL HIGH (ref 70–99)
Glucose-Capillary: 201 mg/dL — ABNORMAL HIGH (ref 70–99)
Glucose-Capillary: 119 mg/dL — ABNORMAL HIGH (ref 70–99)

## 2018-11-14 MED ORDER — TORSEMIDE 20 MG PO TABS
40.0000 mg | ORAL_TABLET | Freq: Every day | ORAL | Status: DC
  Administered 2018-11-14 – 2018-11-15 (×2): 40 mg via ORAL
  Filled 2018-11-14 (×2): qty 2

## 2018-11-14 MED ORDER — INSULIN ASPART PROT & ASPART (70-30 MIX) 100 UNIT/ML ~~LOC~~ SUSP
10.0000 [IU] | Freq: Two times a day (BID) | SUBCUTANEOUS | Status: DC
  Administered 2018-11-14 (×2): 10 [IU] via SUBCUTANEOUS
  Filled 2018-11-14 (×2): qty 10

## 2018-11-14 MED ORDER — LISINOPRIL 5 MG PO TABS
2.5000 mg | ORAL_TABLET | Freq: Every day | ORAL | Status: DC
  Administered 2018-11-14 – 2018-11-15 (×2): 2.5 mg via ORAL
  Filled 2018-11-14 (×2): qty 1

## 2018-11-14 NOTE — Progress Notes (Signed)
[facility]  ROUNDING NOTE   Subjective:   Furosemide gtt continue along with IV albumin infusion 06/24 0701 - 06/25 0700 In: 480 [P.O.:480] Out: 3720 [Urine:3720]  Edema has improved significantly  Objective:  Vital signs in last 24 hours:  Temp:  [98 F (36.7 C)-99.4 F (37.4 C)] 98 F (36.7 C) (06/25 0724) Pulse Rate:  [65-75] 65 (06/25 0724) Resp:  [16-19] 18 (06/25 0724) BP: (128-153)/(66-88) 136/72 (06/25 0724) SpO2:  [86 %-98 %] 86 % (06/25 0914) Weight:  [61.3 kg] 61.3 kg (06/25 0330)  Weight change: -4.653 kg Filed Weights   11/12/18 0430 11/13/18 0437 11/14/18 0330  Weight: 67.7 kg 66 kg 61.3 kg    Intake/Output: I/O last 3 completed shifts: In: 480 [P.O.:480] Out: 5545 [Urine:5545]   Intake/Output this shift:  No intake/output data recorded.  Physical Exam: General: NAD,   Head: Normocephalic, atraumatic. Moist oral mucosal membranes  Eyes: Anicteric,   Neck: Supple,   Lungs:   Mild bilateral crackles  Heart: Regular rate and rhythm  Abdomen:  +distended  Extremities:   peripheral edema.  Neurologic: Nonfocal, moving all four extremities  Skin: No lesions   Basic Metabolic Panel: Recent Labs  Lab 11/09/18 0439 11/10/18 0408 11/11/18 0742 11/13/18 0709 11/14/18 0402  NA 138 135 138 137 137  K 4.1 4.2 4.0 3.7 3.9  CL 101 98 98 92* 90*  CO2 29 29 31  34* 35*  GLUCOSE 86 127* 88 256* 251*  BUN 52* 59* 58* 64* 64*  CREATININE 2.82* 2.91* 2.69* 2.44* 2.26*  CALCIUM 8.3* 8.3* 9.2 9.6 9.8  PHOS  --  3.2  --   --   --     Liver Function Tests: Recent Labs  Lab 11/08/18 0411 11/10/18 0408  AST 20  --   ALT 17  --   ALKPHOS 65  --   BILITOT 0.9  --   PROT 4.7*  --   ALBUMIN 2.2* 3.0*   No results for input(s): LIPASE, AMYLASE in the last 168 hours. No results for input(s): AMMONIA in the last 168 hours.  CBC: Recent Labs  Lab 11/08/18 0411 11/09/18 0439  WBC 5.5 5.6  HGB 9.6* 9.6*  HCT 29.4* 29.6*  MCV 92.5 91.6   PLT 141* 143*    Cardiac Enzymes: No results for input(s): CKTOTAL, CKMB, CKMBINDEX, TROPONINI in the last 168 hours.  BNP: Invalid input(s): POCBNP  CBG: Recent Labs  Lab 11/13/18 0726 11/13/18 1126 11/13/18 1653 11/13/18 2100 11/14/18 0725  GLUCAP 232* 269* 240* 217* 226*    Microbiology: Results for orders placed or performed during the hospital encounter of 11/04/18  SARS Coronavirus 2 (CEPHEID - Performed in [place] hospital lab), Hosp Order     Status: None   Collection Time: 11/04/18  2:38 PM   Specimen: Nasopharyngeal Swab  Result Value Ref Range Status   SARS Coronavirus 2 NEGATIVE NEGATIVE Final    Comment: (NOTE) If result is NEGATIVE SARS-CoV-2 target nucleic acids are NOT DETECTED. The SARS-CoV-2 RNA is generally detectable in upper and lower  respiratory specimens during the acute phase of infection. The lowest  concentration of SARS-CoV-2 viral copies this assay can detect is 250  copies / mL. A negative result does not preclude SARS-CoV-2 infection  and should not be used as the sole basis for treatment or other  patient management decisions.  A negative result may occur with  improper specimen collection / handling, submission of specimen other  than nasopharyngeal swab,  presence of viral mutation(s) within the  areas targeted by this assay, and inadequate number of viral copies  (<250 copies / mL). A negative result must be combined with clinical  observations, patient history, and epidemiological information. If result is POSITIVE SARS-CoV-2 target nucleic acids are DETECTED. The SARS-CoV-2 RNA is generally detectable in upper and lower  respiratory specimens dur ing the acute phase of infection.  Positive  results are indicative of active infection with SARS-CoV-2.  Clinical  correlation with patient history and other diagnostic information is  necessary to determine patient infection status.  Positive results do  not rule out bacterial  infection or co-infection with other viruses. If result is PRESUMPTIVE POSTIVE SARS-CoV-2 nucleic acids MAY BE PRESENT.   A presumptive positive result was obtained on the submitted specimen  and confirmed on repeat testing.  While 2019 novel coronavirus  (SARS-CoV-2) nucleic acids may be present in the submitted sample  additional confirmatory testing may be necessary for epidemiological  and / or clinical management purposes  to differentiate between  SARS-CoV-2 and other Sarbecovirus currently known to infect humans.  If clinically indicated additional testing with an alternate test  methodology [phone]) is advised. The SARS-CoV-2 RNA is generally  detectable in upper and lower respiratory sp ecimens during the acute  phase of infection. The expected result is Negative. Fact Sheet for Patients:  StrictlyIdeas.no Fact Sheet for Healthcare Providers: BankingDealers.co.za This test is not yet approved or cleared by the [place] FDA and has been authorized for detection and/or diagnosis of SARS-CoV-2 by FDA under an Emergency Use Authorization (EUA).  This EUA will remain in effect (meaning this test can be used) for the duration of the COVID-19 declaration under Section 564(b)(1) of the Act, 21 U.S.C. section 360bbb-3(b)(1), unless the authorization is terminated or revoked sooner. Performed at [facility], [address] [place], [place] [address]     Coagulation Studies: No results for input(s): LABPROT, INR in the last 72 hours.  Urinalysis: No results for input(s): COLORURINE, LABSPEC, PHURINE, GLUCOSEU, HGBUR, BILIRUBINUR, KETONESUR, PROTEINUR, UROBILINOGEN, NITRITE, LEUKOCYTESUR in the last 72 hours.  Invalid input(s): APPERANCEUR    Imaging: No results found.   Medications:   . furosemide (LASIX) infusion 10 mg/hr (11/14/18 0554)   . carvedilol  12.5 mg Oral BID WC  . feeding supplement (ENSURE  ENLIVE)  237 mL Oral BID BM  . heparin injection (subcutaneous)  5,000 Units Subcutaneous Q8H  . hydrALAZINE  25 mg Oral Q8H  . insulin aspart  0-15 Units Subcutaneous TID WC  . insulin aspart  0-5 Units Subcutaneous QHS  . insulin aspart protamine- aspart  10 Units Subcutaneous BID WC  . sodium chloride flush  3 mL Intravenous Once  . sodium chloride flush  3 mL Intravenous Q12H   acetaminophen **OR** acetaminophen, albuterol, hydrALAZINE, ondansetron **OR** ondansetron (ZOFRAN) IV, oxyCODONE-acetaminophen, polyethylene glycol  Assessment/ Plan:  Mr. Kevin Gibson is a 78 y.o. 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  Baseline creatinine of 2.2, GFR of 28 on 05/13/2018. 3.5 grams of proteinuria at that time.  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  Serologies neg so far Acute renal failure could be progression of chronic kidney disease versus acute injury from acute cardiorenal  syndrome  -So far has diuresed 14 L with IV Lasix infusion We will change to oral torsemide at 40 mg daily and observe   2.  Generalized edema/volume overload and Acute exacerbation of systolic congestive heart failure: EF 20-25%.  -Discussed with patient that if medical management fails, we may have to consider hemodialysis  3. Diabetes mellitus type II with chronic kidney disease: poorly controlled. Lab Results  Component Value Date   HGBA1C 12.1 (H) 11/04/2018   Insulin dependent.   Patient was not taking proper doses of insulin due to difficulty in affording medications.   LOS: [place] 6/25/20209:42 AM

## 2018-11-14 NOTE — Progress Notes (Signed)
[place] [facility] [place] NAME: Kevin Gibson    MR#:  [id number]  DATE OF BIRTH:  [birth year]  SUBJECTIVE:    No new complaints Good UOP over 24 hours Looks better overall He is wanting to go home soon!  REVIEW OF SYSTEMS:  CONSTITUTIONAL: No fever Positive for fatigue  and weakness.  EYES: No blurred or double vision.  EARS, NOSE, AND THROAT: No tinnitus or ear pain.  RESPIRATORY: No cough, shortness of breath, wheezing or hemoptysis.  CARDIOVASCULAR: No chest pain, orthopnea positive edema.  GASTROINTESTINAL: No nausea, vomiting, diarrhea or abdominal pain.  GENITOURINARY: No dysuria, hematuria.  ENDOCRINE: No polyuria, nocturia,  HEMATOLOGY: No anemia, easy bruising or bleeding SKIN: No rash or lesion. MUSCULOSKELETAL: No joint pain or arthritis.   NEUROLOGIC: No tingling, numbness, +weakness.  PSYCHIATRY: No anxiety or depression.   DRUG ALLERGIES:  No Known Allergies  VITALS:  Blood pressure 136/72, pulse 65, temperature 98 F (36.7 C), temperature source Oral, resp. rate 18, height 5\' 7"  (1.702 m), weight 61.3 kg, SpO2 98 %.  PHYSICAL EXAMINATION:  GENERAL:  78 y.o.-year-old patient lying in the bed with no acute distress. Anasarca+ EYES: Pupils equal, round, reactive to light and accommodation. No scleral icterus. Extraocular muscles intact.  HEENT: Head atraumatic, normocephalic. Oropharynx and nasopharynx clear.  NECK:  Supple, no jugular venous distention. No thyroid enlargement, no tenderness.  LUNGS: Normal breath sounds bilaterally, no wheezing, rales,rhonchi or crepitation. No use of accessory muscles of respiration.  CARDIOVASCULAR: S1, S2 normal. No murmurs, rubs, or gallops.  ABDOMEN: Soft, nontender, nondistended. Bowel sounds present. EXTREMITIES: Significant pedal edema and left upper extremity pitting edema, 2+, no cyanosis, or clubbing.  NEUROLOGIC: Cranial nerves II through XII are intact. Muscle  strength 5/5 in all extremities. Sensation intact. Gait not checked. gen weakness PSYCHIATRIC: The patient is alert and oriented x 3.  SKIN: No obvious rash, lesion, or ulcer.    LABORATORY PANEL:   CBC Recent Labs  Lab 11/09/18 0439  WBC 5.6  HGB 9.6*  HCT 29.6*  PLT 143*   ------------------------------------------------------------------------------------------------------------------  Chemistries  Recent Labs  Lab 11/08/18 0411  11/14/18 0402  NA 137   < > 137  K 3.9   < > 3.9  CL 102   < > 90*  CO2 28   < > 35*  GLUCOSE 224*   < > 251*  BUN 48*   < > 64*  CREATININE 2.62*   < > 2.26*  CALCIUM 8.3*   < > 9.8  AST 20  --   --   ALT 17  --   --   ALKPHOS 65  --   --   BILITOT 0.9  --   --    < > = values in this interval not displayed.   ------------------------------------------------------------------------------------------------------------------  Cardiac Enzymes No results for input(s): TROPONINI in the last 168 hours. ------------------------------------------------------------------------------------------------------------------  RADIOLOGY:  No results found.  EKG:   Orders placed or performed during the hospital encounter of 11/04/18  . EKG 12-Lead  . EKG 12-Lead  . ED EKG  . 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   Patient still has significant swelling  albumin level very low 2.2 started IV Lasix gtt with IV albumin -EF 20-25% -unable to give ACE/ARB due to rising creat -d/c norvasc -started coreg and hydralazine  *AKI on CKD stage III.  Baseline creatinine at 2 Baseline creatinine 2.1, creatinine 2.62--2.91-2.62-2.44--2.26 Nephrology consult with Dr. Juleen China appreciated On IV lasix gtt (June 20th) -total uop >14 liters  *Diabetes mellitus.  Insulin-dependent.  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  -typically we don't treat superficial thrombophlebitis/occlusion. This was discussed with Dr. Juleen China. I agree with discontinuing eliquis given other several comorbidities and risk for G.I. bleed  *Essential hypertension Pt was on Norvasc and labetalol---given anasarca and severe cardiomyopathy now on coreg and hydralazine  *Severe hypo-hypoalbuminemia due to nephrotic syndrome - per nephrology  patient's contact is daughter Emelia Loron Ph Number [phone] Spoke with her this am  CODE STATUS: fc  TOTAL TIME TAKING CARE OF THIS PATIENT:25  minutes.   POSSIBLE D/C IN 1-2 DAYS, DEPENDING ON CLINICAL CONDITION.  Note: This dictation was prepared with Dragon dictation along with smaller phrase technology. Any transcriptional errors that result from this process are unintentional.   Fritzi Mandes M.D on 11/14/2018 at 8:06 AM  Between 7am to 6pm - Pager - [phone] After 6pm go to [email or web address] - password [email or web address] [facility]  [facility]  Office  [phone]  CC: Primary care physician; Kirk Ruths, MD

## 2018-11-14 NOTE — Progress Notes (Signed)
Inpatient Diabetes Program Recommendations  AACE/ADA: New Consensus Statement on Inpatient Glycemic Control   Target Ranges:  Prepandial:   less than 140 mg/dL      Peak postprandial:   less than 180 mg/dL (1-2 hours)      Critically ill patients:  140 - 180 mg/dL   Results for EZERIAH, LUTY (MRN [id number]) as of 11/14/2018 07:23  Ref. Range 11/13/2018 07:26 11/13/2018 11:26 11/13/2018 16:53 11/13/2018 21:00  Glucose-Capillary Latest Ref Range: 70 - 99 mg/dL 232 (H) 269 (H) 240 (H) 217 (H)   Review of Glycemic Control  Diabetes history: DM2 Outpatient Diabetes medications: None; stopped taking due to cost Current orders for Inpatient glycemic control: 70/30 6 units BID, Novolog 0-15 units TID with meals, Novolog 0-5 units QHS  Inpatient Diabetes Program Recommendations:   Insulin - Basal: Please consider increasing 70/30 to 10 units BID (will provide a total of 14 units for basal and 6 units for meal coverage per day).  Thanks, Barnie Alderman, RN, MSN, CDE Diabetes Coordinator Inpatient Diabetes Program [phone] (Team Pager from 8am to 5pm)

## 2018-11-14 NOTE — Progress Notes (Signed)
Physical Therapy Treatment Patient Details Name: Kevin Gibson MRN: [id number] DOB: [birth year] Today's Date: 11/14/2018    History of Present Illness Per MD note:Kevin Gibson  is a 78 y.o. 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  He has noticed significant swelling and bilateral lower extremities and right upper extremity in the last 5 days.  No history of DVT.  Patient here has been found to have pulmonary edema on chest x-ray, significantly elevated BNP of greater than 4500.  Oxygen saturations at times dropped into the high 80s.  Patient is being admitted for CHF needing IV diuresis as inpatient.  Blood sugars found to be 427.  Patient stopped taking his insulin a few months back.    PT Comments    Pt in bed.  Continues with reports of L thigh soreness.  RN requesting qualifying O2 sats for possible discharge home tomorrow.  97% on 2 lpm at rest.  O2 removed.  Pt was able to ambulate 50' before sats decreased to 86% on room air.  Returned to room and replaced O2 at 2 lpm.   While pt was able to walk further today with overall improved tolerance, his gait was more unsteady.  He was able to recover LOB's without assist but is at a higher risk for falls.  Discussed use of walker and he agreed that it would be helpful at home.  He stated he had resisted in the past because he wanted to remain independent but wishes for one at home.  Education on use provided but pt did not want to walk further at this time.     Follow Up Recommendations  Home health PT     Equipment Recommendations  Rolling walker with 5" wheels    Recommendations for Other Services       Precautions / Restrictions Precautions Precautions: Fall Restrictions Weight Bearing Restrictions: No    Mobility  Bed  Mobility Overal bed mobility: Modified Independent                Transfers Overall transfer level: Modified independent                  Ambulation/Gait Ambulation/Gait assistance: Min guard;Min assist Gait Distance (Feet): 100 Feet Assistive device: None Gait Pattern/deviations: Step-through pattern;Decreased step length - right;Decreased step length - left;Drifts right/left;Narrow base of support Gait velocity: decreased   General Gait Details: slow gait with some unsteadiness today which he attributes to L LE soreness   Stairs             Wheelchair Mobility    Modified Rankin (Stroke Patients Only)       Balance Overall balance assessment: Needs assistance Sitting-balance support: Feet supported Sitting balance-Leahy Scale: Good     Standing balance support: No upper extremity supported Standing balance-Leahy Scale: Fair Standing balance comment: unsteady at times.  while he is able to recover without assist, he agrees he would benefit from a walker.                            Cognition Arousal/Alertness: Awake/alert Behavior During Therapy: WFL for tasks assessed/performed Overall Cognitive Status: Within Functional Limits for tasks assessed  Exercises      General Comments        Pertinent Vitals/Pain Pain Assessment: 0-10 Pain Score: 3  Pain Location: Left upper thigh soreness Pain Descriptors / Indicators: Sore Pain Intervention(s): Limited activity within patient's tolerance;Monitored during session    Home Living                      Prior Function            PT Goals (current goals can now be found in the care plan section) Progress towards PT goals: Progressing toward goals    Frequency    Min 2X/week      PT Plan Current plan remains appropriate    Co-evaluation              AM-PAC PT "6 Clicks" Mobility   Outcome Measure   Help needed turning from your back to your side while in a flat bed without using bedrails?: None Help needed moving from lying on your back to sitting on the side of a flat bed without using bedrails?: None Help needed moving to and from a bed to a chair (including a wheelchair)?: None Help needed standing up from a chair using your arms (e.g., wheelchair or bedside chair)?: None Help needed to walk in hospital room?: A Little Help needed climbing 3-5 steps with a railing? : A Little 6 Click Score: 22    End of Session Equipment Utilized During Treatment: Gait belt Activity Tolerance: Patient tolerated treatment well Patient left: in bed;with bed alarm set;with call bell/phone within reach Nurse Communication: Other (comment)       Time: 1194-1740 PT Time Calculation (min) (ACUTE ONLY): 12 min  Charges:  $Gait Training: 8-22 mins                    Kevin Gibson, PTA 11/14/18, 9:31 AM

## 2018-11-14 NOTE — Care Management Important Message (Signed)
Important Message  Patient Details  Name: Kevin Gibson MRN: [id number] Date of Birth: 12-Oct-1940   Medicare Important Message Given:  Yes     Dannette Barbara 11/14/2018, 12:05 PM

## 2018-11-15 LAB — GLUCOSE, CAPILLARY
Glucose-Capillary: 204 mg/dL — ABNORMAL HIGH (ref 70–99)
Glucose-Capillary: 161 mg/dL — ABNORMAL HIGH (ref 70–99)

## 2018-11-15 LAB — BASIC METABOLIC PANEL
Anion gap: 11 (ref 5–15)
BUN: 61 mg/dL — ABNORMAL HIGH (ref 8–23)
CO2: 38 mmol/L — ABNORMAL HIGH (ref 22–32)
Calcium: 9.3 mg/dL (ref 8.9–10.3)
Chloride: 89 mmol/L — ABNORMAL LOW (ref 98–111)
Creatinine, Ser: 2.23 mg/dL — ABNORMAL HIGH (ref 0.61–1.24)
GFR calc Af Amer: 32 mL/min — ABNORMAL LOW (ref >60)
GFR calc non Af Amer: 28 mL/min — ABNORMAL LOW (ref >60)
Glucose, Bld: 110 mg/dL — ABNORMAL HIGH (ref 70–99)
Potassium: 3.8 mmol/L (ref 3.5–5.1)
Sodium: 138 mmol/L (ref 135–145)

## 2018-11-15 MED ORDER — TORSEMIDE 20 MG PO TABS: 40.0000 mg | ORAL_TABLET | Freq: Every day | ORAL | 1 refills | Status: AC

## 2018-11-15 MED ORDER — INSULIN ASPART PROT & ASPART (70-30 MIX) 100 UNIT/ML ~~LOC~~ SUSP: 15.0000 [IU] | Freq: Two times a day (BID) | SUBCUTANEOUS | 11 refills | Status: AC

## 2018-11-15 MED ORDER — LISINOPRIL 2.5 MG PO TABS: 2.5000 mg | ORAL_TABLET | Freq: Every day | ORAL | 1 refills | Status: AC

## 2018-11-15 MED ORDER — HYDRALAZINE HCL 25 MG PO TABS: 25.0000 mg | ORAL_TABLET | Freq: Two times a day (BID) | ORAL | 1 refills | Status: AC

## 2018-11-15 MED ORDER — CARVEDILOL 12.5 MG PO TABS: 12.5000 mg | ORAL_TABLET | Freq: Two times a day (BID) | ORAL | 1 refills | Status: AC

## 2018-11-15 MED ORDER — INSULIN ASPART PROT & ASPART (70-30 MIX) 100 UNIT/ML ~~LOC~~ SUSP
15.0000 [IU] | Freq: Two times a day (BID) | SUBCUTANEOUS | Status: DC
  Administered 2018-11-15: 15 [IU] via SUBCUTANEOUS
  Filled 2018-11-15: qty 10

## 2018-11-15 NOTE — Discharge Summary (Signed)
[facility] NAME: Kevin Gibson    MR#:  [id number]  DATE OF BIRTH:  [birth year]  DATE OF ADMISSION:  11/04/2018 ADMITTING PHYSICIAN: Hillary Bow, MD  DATE OF DISCHARGE: 11/15/2018  PRIMARY CARE PHYSICIAN: Kirk Ruths, MD    ADMISSION DIAGNOSIS:  SOB (shortness of breath) [R06.02] Elevated troponin [R79.89] Acute congestive heart failure, unspecified heart failure type ([place]) [I50.9]  DISCHARGE DIAGNOSIS:  Generalized edema/volume overload and Acute on chornic exacerbation of systolic congestive heart failure: EF 20-25%.  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   Diabetes mellitus type II with chronic kidney disease: poorly controlled SECONDARY DIAGNOSIS:   Past Medical History:  Diagnosis Date  . Chronic kidney disease   . Diabetes mellitus without complication ([place])   . Heart murmur   . Hyperlipidemia   . Hypertension   . Pacemaker   . RBBB   . Syncope and collapse   . Third degree heart block [facility])     HOSPITAL COURSE:   *Acute on chronic severesystolic congestive heart failure and severe dilated CMP.  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 Madelon Lips appreciated On IV lasix gtt (June 20th)--now on po torsemid -total uop >17 liters -weight decreasing overall ( 72--60 kg)  *Diabetes mellitus-2, Insulin-dependent.  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 -typically we don't treat superficial thrombophlebitis/occlusion. This was discussed with Dr. Juleen China. I agree with discontinuing eliquis given other several comorbidities and risk for G.I. bleed  *Essential hypertension Pt was on Norvasc and labetalol---given anasarca and severe cardiomyopathy now on coreg , lisinopril and hydralazine  *Severe hypo-hypoalbuminemia due to nephrotic syndrome - per nephrology  Overall better. D/c home  dter mary schimdt aware  CONSULTS OBTAINED:  Treatment Team:  Corey Skains, MD Lavonia Dana, MD  DRUG ALLERGIES:  No Known Allergies  DISCHARGE MEDICATIONS:   Allergies as of 11/15/2018   No Known Allergies     Medication List    STOP taking these medications   amLODipine 10 MG tablet Commonly known as: NORVASC   labetalol 200 MG tablet Commonly known as: NORMODYNE     TAKE these medications   aspirin EC 81 MG tablet Take 81 mg by mouth every other day.   carvedilol 12.5 MG tablet Commonly known as: COREG Take 1 tablet (12.5 mg total) by mouth 2 (two) times daily with a meal.   hydrALAZINE 25 MG tablet Commonly known as: APRESOLINE Take 1 tablet (25 mg total) by mouth 2 (two) times daily.   insulin aspart protamine- aspart (70-30) 100 UNIT/ML injection Commonly known as: NOVOLOG MIX 70/30 Inject 0.15 mLs (15 Units total) into the skin 2 (two) times daily with a meal.   lisinopril 2.5 MG tablet Commonly known as: ZESTRIL Take 1 tablet (2.5 mg total) by mouth daily.   torsemide 20 MG tablet Commonly known as: DEMADEX Take 2 tablets (40 mg total) by mouth daily.  Durable Medical Equipment  (From admission, onward)         Start     Ordered   11/15/18 0758  DME Oxygen  Once    Question Answer Comment  Length of Need Lifetime   Mode or (Route) Nasal cannula   Liters per Minute 2   Frequency Continuous (stationary and portable oxygen unit needed)   Oxygen  conserving device Yes   Oxygen delivery system Gas      11/15/18 0757          If you experience worsening of your admission symptoms, develop shortness of breath, life threatening emergency, suicidal or homicidal thoughts you must seek medical attention immediately by calling 911 or calling your MD immediately  if symptoms less severe.  You Must read complete instructions/literature along with all the possible adverse reactions/side effects for all the Medicines you take and that have been prescribed to you. Take any new Medicines after you have completely understood and accept all the possible adverse reactions/side effects.   Please note  You were cared for by a hospitalist during your hospital stay. If you have any questions about your discharge medications or the care you received while you were in the hospital after you are discharged, you can call the unit and asked to speak with the hospitalist on call if the hospitalist that took care of you is not available. Once you are discharged, your primary care physician will handle any further medical issues. Please note that NO REFILLS for any discharge medications will be authorized once you are discharged, as it is imperative that you return to your primary care physician (or establish a relationship with a primary care physician if you do not have one) for your aftercare needs so that they can reassess your need for medications and monitor your lab values. Today   SUBJECTIVE   I feel good  VITAL SIGNS:  Blood pressure 138/74, pulse 69, temperature 98.6 F (37 C), temperature source Oral, resp. rate 18, height 5\' 7"  (1.702 m), weight 59.8 kg, SpO2 97 %.  I/O:    Intake/Output Summary (Last 24 hours) at 11/15/2018 0759 Last data filed at 11/15/2018 0320 Gross per 24 hour  Intake 1370.21 ml  Output 2100 ml  Net -729.79 ml    PHYSICAL EXAMINATION:  GENERAL:  78 y.o.-year-old patient lying in the bed with no acute distress.  EYES:  Pupils equal, round, reactive to light and accommodation. No scleral icterus. Extraocular muscles intact.  HEENT: Head atraumatic, normocephalic. Oropharynx and nasopharynx clear.  NECK:  Supple, no jugular venous distention. No thyroid enlargement, no tenderness.  LUNGS: Normal breath sounds bilaterally, no wheezing, rales,rhonchi or crepitation. No use of accessory muscles of respiration.  CARDIOVASCULAR: S1, S2 normal. No murmurs, rubs, or gallops.  ABDOMEN: Soft, non-tender, non-distended. Bowel sounds present. No organomegaly or mass.  EXTREMITIES: no cyanosis, or clubbing.  -swelling improving in bother UE and LE NEUROLOGIC: Cranial nerves II through XII are intact. Muscle strength 5/5 in all extremities. Sensation intact. Gait not checked.  PSYCHIATRIC:  patient is alert and oriented x 3.  SKIN: No obvious rash, lesion, or ulcer.   DATA REVIEW:   CBC  Recent Labs  Lab 11/09/18 0439  WBC 5.6  HGB 9.6*  HCT 29.6*  PLT 143*    Chemistries  Recent Labs  Lab 11/15/18 0234  NA 138  K 3.8  CL 89*  CO2 38*  GLUCOSE 110*  BUN 61*  CREATININE 2.23*  CALCIUM 9.3  Microbiology Results   No results found for this or any previous visit (from the past 240 hour(s)).  RADIOLOGY:  No results found.   CODE STATUS:     Code Status Orders  (From admission, onward)         Start     Ordered   11/04/18 1632  Full code  Continuous     11/04/18 1633        Code Status History    Date Active Date Inactive Code Status Order ID Comments User Context   11/26/2013 1715 11/28/2013 1426 Full Code [id number]  Deboraha Sprang, MD Inpatient   Advance Care Planning Activity      TOTAL TIME TAKING CARE OF THIS PATIENT: 40 minutes.    Fritzi Mandes M.D on 11/15/2018 at 7:59 AM  Between 7am to 6pm - Pager - [phone] After 6pm go to [email or web address] - password [email or web address] [facility]  Office  [phone]  CC: Primary care physician; Kirk Ruths,  MD

## 2018-11-15 NOTE — Progress Notes (Signed)
Cardiac Rehab Navigator/ Exercise Physiologist Note  Patient has been admitted for 11 days. Patient was very sleepy during initial education sessions. Patient dozed off during videos. This EP reviewed all education topics with patient before discharge today. Patient found sitting up in bed ready to go home. Patient was able to verbalize understanding of the importance of the 5 Simple Steps of Living Better with Heart Failure and wanting to achieve those goals. Patient reports wanting to apply for Medicaid for help with medication and healthcare, application in room. Patient reports wanting to move into better living quarters, possibly with his daughter. Patient is looking forward to seeing his family and his cat when he goes home. Patient hands and legs are back to baseline and reports that his shortness of breath is better but he is unsure about how far he will be able to walk without getting short of breath. Patient was encouraged to attend Cardiac Rehab to rebuild his strength and stamina while in a clinical setting. Patient encouraged to apply for Medicaid as soon as he gets home to help him feel better about accessing all of the resources he can for his healthcare.   CHF Education:??  Educational session with patient completed.  Provided patient with "Living Better with Heart Failure" packet. Briefly reviewed definition of heart failure and signs and symptoms of an exacerbation.This EP discussed potential causes of CHF.  Explained to patient that HF is a chronic illness which requires self-assessment / self-management along with help from the cardiologist/PCP. This discussed definition of EF measurement along with normal value compared to patient's EF  20-25%.?  *Reviewed importance of and reason behind checking weight daily in the AM, after using the bathroom, but before getting dressed. Patient has been provided with scales by [facility]. Patient will weigh daily in the AM and record weight on provided  calendar. Patient will call Dr. Ouida Sills if weight goes up more than 2-3 lbs in 1 day or 5 lbs in 1 week.  *Reviewed with patient the following information:  *Discussed when to call the Dr= weight gain of >2-3lb overnight of 5lb in a week,  *Discussed yellow zone= call MD: weight gain of >2-3lb overnight of 5lb in a week, increased swelling, increased SOB when lying down, chest discomfort, dizziness, increased fatigue  *Red Zone= call 911: struggle to breath, fainting or near fainting, significant chest pain  *Heart Failure Zone Magnet given and reviewed with patient.   *Diet - Patient currently ordered carb modified. This EP instructed patient to follow a low sodium diet of 2000 mg or less.  Recommended foods for low sodium heart failure carb modified nutrition therapy discussed. Reviewed with patient steps to reading a food label with close attention to serving size and mg of sodium. ?  *Discussed fluid intake with patient as well. Patient not currently on a fluid restriction, but advised no more than 64 ounces of fluid per day. Demonstrated this volume to patient using the bedside water pitcher. ?  *Instructed patient to take medications as prescribed for heart failure. Explained briefly why patient is on the medications (either make you feel better, live longer or keep you out of the hospital) and discussed monitoring and side effects.?  *Discussed exercise / activity. Patient is currently sedentary due to shortness of breath over the past few months. Patient wants to be active. Encouraged patient to be as active as possible. Patient would be a great candidate for Cardiac Rehab. Patient may have financial barriers to affording the program.  Patient may need to wait until his medicaid application is processed to decide if he can attend. Patient is not interested in [employer] because he does not have access to equipment. Patient originally stated that he did not have access to a phone but did  give a phone number today which may be his daughter's phone number. He is hoping to start living with her.  *Smoking Cessation- Patient is a FORMER smoker.?  *[facility] - Explained the purpose of the HF Clinic. Explained to patient the HF Clinic does not replace PCP nor Cardiologist, but is an additional resource to helping patient manage heart failure at home. Patient appt is scheduled for 12/04/18 at 9:30am.  Again, the 5 Steps to Living Better with Heart Failure were reviewed with patient.  Patient thanked me for providing the above information. ?  Jasper Loser, [facility]  Exercise Physiologist Department Phone #: [phone] Fax: [phone]  Direct Line [phone] Email Address: [email or web address]

## 2018-11-15 NOTE — Plan of Care (Signed)
Pt ready for discharge.   Problem: Education: Goal: Knowledge of General Education information will improve Description: Including pain rating scale, medication(s)/side effects and non-pharmacologic comfort measures Outcome: Completed/Met   Problem: Health Behavior/Discharge Planning: Goal: Ability to manage health-related needs will improve Outcome: Completed/Met   Problem: Clinical Measurements: Goal: Ability to maintain clinical measurements within normal limits will improve Outcome: Completed/Met Goal: Will remain free from infection Outcome: Completed/Met Goal: Diagnostic test results will improve Outcome: Completed/Met Goal: Respiratory complications will improve Outcome: Completed/Met Goal: Cardiovascular complication will be avoided Outcome: Completed/Met   Problem: Activity: Goal: Risk for activity intolerance will decrease Outcome: Completed/Met   Problem: Nutrition: Goal: Adequate nutrition will be maintained Outcome: Completed/Met   Problem: Coping: Goal: Level of anxiety will decrease Outcome: Completed/Met   Problem: Elimination: Goal: Will not experience complications related to bowel motility Outcome: Completed/Met Goal: Will not experience complications related to urinary retention Outcome: Completed/Met   Problem: Pain Managment: Goal: General experience of comfort will improve Outcome: Completed/Met   Problem: Safety: Goal: Ability to remain free from injury will improve Outcome: Completed/Met   Problem: Skin Integrity: Goal: Risk for impaired skin integrity will decrease Outcome: Completed/Met   Problem: Education: Goal: Ability to demonstrate management of disease process will improve Outcome: Completed/Met Goal: Ability to verbalize understanding of medication therapies will improve Outcome: Completed/Met Goal: Individualized Educational Video(s) Outcome: Completed/Met   Problem: Activity: Goal: Capacity to carry out activities will  improve Outcome: Completed/Met   Problem: Cardiac: Goal: Ability to achieve and maintain adequate cardiopulmonary perfusion will improve Outcome: Completed/Met   Problem: Acute Rehab PT Goals(only PT should resolve) Goal: Pt Will Transfer Bed To Chair/Chair To Bed Outcome: Completed/Met Goal: Pt Will Ambulate Outcome: Completed/Met

## 2018-11-15 NOTE — Progress Notes (Signed)
[facility]  ROUNDING NOTE   Subjective:    06/25 0701 - 06/26 0700 In: 1370.2 [P.O.:840; I.V.:530.2] Out: 2100 [Urine:2100]  Edema has improved significantly Transitioned to torsemide yesterday which he tolerated well  Objective:  Vital signs in last 24 hours:  Temp:  [97.7 F (36.5 C)-98.6 F (37 C)] 98.6 F (37 C) (06/26 0734) Pulse Rate:  [65-69] 69 (06/26 0734) Resp:  [16-18] 18 (06/26 0734) BP: (111-138)/(60-74) 138/74 (06/26 0734) SpO2:  [97 %-100 %] 97 % (06/26 0734) Weight:  [59.8 kg] 59.8 kg (06/26 0320)  Weight change: -1.516 kg Filed Weights   11/13/18 0437 11/14/18 0330 11/15/18 0320  Weight: 66 kg 61.3 kg 59.8 kg    Intake/Output: I/O last 3 completed shifts: In: 1610.2 [P.O.:1080; I.V.:530.2] Out: 4670 [Urine:4670]   Intake/Output this shift:  Total I/O In: 240 [P.O.:240] Out: 250 [Urine:250]  Physical Exam: General: NAD,   Head: Normocephalic, atraumatic. Moist oral mucosal membranes  Eyes: Anicteric,   Neck: Supple,   Lungs:   Clear to auscultation  Heart: Regular rate and rhythm  Abdomen:   Soft, nontender  Extremities:   + peripheral edema.  Neurologic: Nonfocal, moving all four extremities  Skin: No lesions   Basic Metabolic Panel: Recent Labs  Lab 11/10/18 0408 11/11/18 0742 11/13/18 0709 11/14/18 0402 11/15/18 0234  NA 135 138 137 137 138  K 4.2 4.0 3.7 3.9 3.8  CL 98 98 92* 90* 89*  CO2 29 31 34* 35* 38*  GLUCOSE 127* 88 256* 251* 110*  BUN 59* 58* 64* 64* 61*  CREATININE 2.91* 2.69* 2.44* 2.26* 2.23*  CALCIUM 8.3* 9.2 9.6 9.8 9.3  PHOS 3.2  --   --   --   --     Liver Function Tests: Recent Labs  Lab 11/10/18 0408  ALBUMIN 3.0*   No results for input(s): LIPASE, AMYLASE in the last 168 hours. No results for input(s): AMMONIA in the last 168 hours.  CBC: Recent Labs  Lab 11/09/18 0439  WBC 5.6  HGB 9.6*  HCT 29.6*  MCV 91.6  PLT 143*    Cardiac Enzymes: No results for input(s): CKTOTAL,  CKMB, CKMBINDEX, TROPONINI in the last 168 hours.  BNP: Invalid input(s): POCBNP  CBG: Recent Labs  Lab 11/14/18 1210 11/14/18 1631 11/14/18 2209 11/15/18 0811 11/15/18 1204  GLUCAP 235* 201* 119* 204* 161*    Microbiology: Results for orders placed or performed during the hospital encounter of 11/04/18  SARS Coronavirus 2 (CEPHEID - Performed in [place] hospital lab), Hosp Order     Status: None   Collection Time: 11/04/18  2:38 PM   Specimen: Nasopharyngeal Swab  Result Value Ref Range Status   SARS Coronavirus 2 NEGATIVE NEGATIVE Final    Comment: (NOTE) If result is NEGATIVE SARS-CoV-2 target nucleic acids are NOT DETECTED. The SARS-CoV-2 RNA is generally detectable in upper and lower  respiratory specimens during the acute phase of infection. The lowest  concentration of SARS-CoV-2 viral copies this assay can detect is 250  copies / mL. A negative result does not preclude SARS-CoV-2 infection  and should not be used as the sole basis for treatment or other  patient management decisions.  A negative result may occur with  improper specimen collection / handling, submission of specimen other  than nasopharyngeal swab, presence of viral mutation(s) within the  areas targeted by this assay, and inadequate number of viral copies  (<250 copies / mL). A negative result must be combined with  clinical  observations, patient history, and epidemiological information. If result is POSITIVE SARS-CoV-2 target nucleic acids are DETECTED. The SARS-CoV-2 RNA is generally detectable in upper and lower  respiratory specimens dur ing the acute phase of infection.  Positive  results are indicative of active infection with SARS-CoV-2.  Clinical  correlation with patient history and other diagnostic information is  necessary to determine patient infection status.  Positive results do  not rule out bacterial infection or co-infection with other viruses. If result is PRESUMPTIVE  POSTIVE SARS-CoV-2 nucleic acids MAY BE PRESENT.   A presumptive positive result was obtained on the submitted specimen  and confirmed on repeat testing.  While 2019 novel coronavirus  (SARS-CoV-2) nucleic acids may be present in the submitted sample  additional confirmatory testing may be necessary for epidemiological  and / or clinical management purposes  to differentiate between  SARS-CoV-2 and other Sarbecovirus currently known to infect humans.  If clinically indicated additional testing with an alternate test  methodology [phone]) is advised. The SARS-CoV-2 RNA is generally  detectable in upper and lower respiratory sp ecimens during the acute  phase of infection. The expected result is Negative. Fact Sheet for Patients:  StrictlyIdeas.no Fact Sheet for Healthcare Providers: BankingDealers.co.za This test is not yet approved or cleared by the [place] FDA and has been authorized for detection and/or diagnosis of SARS-CoV-2 by FDA under an Emergency Use Authorization (EUA).  This EUA will remain in effect (meaning this test can be used) for the duration of the COVID-19 declaration under Section 564(b)(1) of the Act, 21 U.S.C. section 360bbb-3(b)(1), unless the authorization is terminated or revoked sooner. Performed at [facility], [address] [place], [place] [address]     Coagulation Studies: No results for input(s): LABPROT, INR in the last 72 hours.  Urinalysis: No results for input(s): COLORURINE, LABSPEC, PHURINE, GLUCOSEU, HGBUR, BILIRUBINUR, KETONESUR, PROTEINUR, UROBILINOGEN, NITRITE, LEUKOCYTESUR in the last 72 hours.  Invalid input(s): APPERANCEUR    Imaging: No results found.   Medications:    . carvedilol  12.5 mg Oral BID WC  . feeding supplement (ENSURE ENLIVE)  237 mL Oral BID BM  . heparin injection (subcutaneous)  5,000 Units Subcutaneous Q8H  . hydrALAZINE  25 mg Oral Q8H  .  insulin aspart  0-15 Units Subcutaneous TID WC  . insulin aspart  0-5 Units Subcutaneous QHS  . insulin aspart protamine- aspart  15 Units Subcutaneous BID WC  . lisinopril  2.5 mg Oral Daily  . sodium chloride flush  3 mL Intravenous Once  . sodium chloride flush  3 mL Intravenous Q12H  . torsemide  40 mg Oral Daily   acetaminophen **OR** acetaminophen, albuterol, hydrALAZINE, ondansetron **OR** ondansetron (ZOFRAN) IV, oxyCODONE-acetaminophen, polyethylene glycol  Assessment/ Plan:  Mr. Kevin Gibson is a 78 y.o. white male with third degree heart block status post pacemaker, hypertension, hyperlipidemia, diabetes mellitus type II, , who was [facility] to [facility] 11/04/2018 for acute exacerbation of congestive heart failure/anasarca/nephrotic syndrome  1. 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  Baseline creatinine of 2.2, GFR of 28 on 05/13/2018. 3.5 grams of proteinuria at that time.  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  Serologies neg so far Acute renal failure could be progression of chronic kidney disease versus acute injury from acute cardiorenal syndrome  -So far has diuresed 14 L with IV Lasix infusion Patient has tolerated oral torsemide well. Serum creatinine now at baseline  of 2.2/GFR 28 Plan to follow-up outpatient in 1 to 2 weeks   2.  Generalized edema/volume overload and Acute exacerbation of systolic congestive heart failure: EF 20-25%.  -Transition to oral diuretics  3. Diabetes mellitus type II with chronic kidney disease: poorly controlled. Lab Results  Component Value Date   HGBA1C 12.1 (H) 11/04/2018   Insulin dependent.   Patient was not taking proper doses of insulin due to difficulty in affording medications.   LOS: [place] 6/26/20202:48 PM

## 2018-11-15 NOTE — TOC Transition Note (Addendum)
Transition of Care [facility]) - CM/SW Discharge Note   Patient Details  Name: Kevin Gibson MRN: [id number] Date of Birth: 1940-09-01  [facility] CM/SW Contact:  Elza Rafter, RN Phone Number: 11/15/2018, 9:09 AM   Clinical Narrative:   Patient discharging to home with son.  Notified Malachy Mood with [facility] to resume home health services.  Encouraged patient to apply for medicaid as he is not always able to afford his medications.  Provided patient with DSS/Medcaid information.   No further needs identified at this time.    Brad with [employer] to deliver oxygen today.  @1423 -Patient does not qualify for oxygen as sats only went down to 91% with ambulation.  Dr. Posey Pronto aware.   Final next level of care: [employer] Barriers to Discharge: No Barriers Identified   Patient Goals and CMS Choice Patient states their goals for this hospitalization and ongoing recovery are:: To return home with home health. CMS Medicare.gov Compare Post Acute Care list provided to:: Patient Choice offered to / list presented to : Patient  Discharge Placement                       Discharge Plan and Services In-house Referral: Clinical Social Work Discharge Planning Services: Other - See comment(Scale provided for patient)            DME Arranged: N/A DME Agency: NA       HH Arranged: NA HH Agency: NA        Social Determinants of Health (SDOH) Interventions     Readmission Risk Interventions No flowsheet data found.

## 2018-12-03 NOTE — Progress Notes (Deleted)
   Patient ID: TALAN GILDNER, male    DOB: Nov 20, 1940, 78 y.o.   MRN: [id number]  HPI  Mr Kuhner is a 78 y/o male with a history of  Echo report from 11/05/2018 reviewed and showed an EF of 20-25% along with severe TR and an elevated PA pressure of 58.1 mmHg  Admitted 11/04/2018 due to acute on chronic HF. Nephrology and cardiology consults obtained. Needed lasix drip initially and then transitioned to oral medications. Albumin was initially low. Discharged after 11 days.   He presents today for his initial visit with a chief complaint of   Review of Systems    Physical Exam    Assessment & Plan:  1: Chronic heart failure with reduced ejection fraction- - NYHA class - BNP 11/04/2018 was >4500.0 - saw EP(K[facility]) 01/23/17  2: HTN- - saw PCP Ouida Sills) 05/20/18 - BMP 11/15/2018 reviewed and showed sodium 138, potassium 3.8, creatinine 2.23 and GFR 28  3: DM- - A1c 11/04/2018 was 12.1%  4: CKD stage 4- - saw nephrology Candiss Norse) 11/21/2018

## 2018-12-04 ENCOUNTER — Ambulatory Visit: Payer: Medicare HMO | Admitting: Family

## 2018-12-04 ENCOUNTER — Telehealth: Payer: Self-pay | Admitting: Family

## 2018-12-04 NOTE — Telephone Encounter (Signed)
Patient did not show for his [facility] appointment on 12/04/2018. Will attempt to reschedule.

## 2020-01-27 IMAGING — US US RENAL
1 series · 14 of 25 positions shown · non-contrast
Comparison: 10/09/2013

CLINICAL DATA: Acute kidney failure, stage IV chronic kidney
disease, hypertension, diabetes mellitus

EXAM:
RENAL / URINARY TRACT ULTRASOUND COMPLETE

[Series 1: us renal · 0.31mm/px · 14 of 36 slices shown]
[im 1/36]
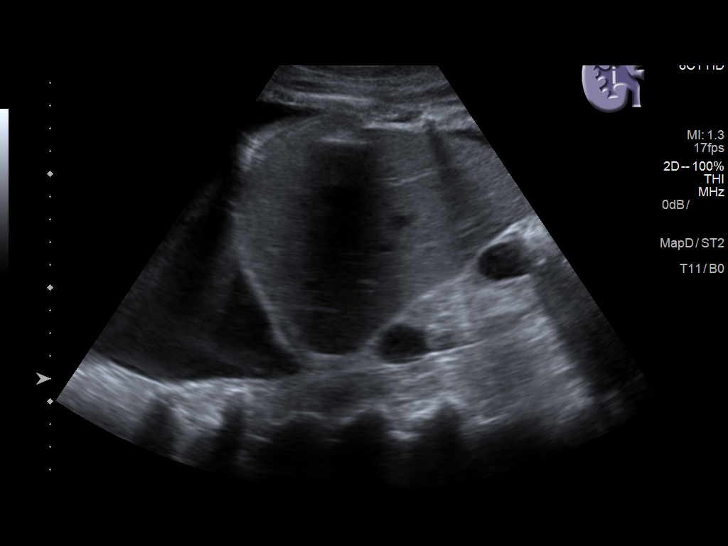
[im 3/36]
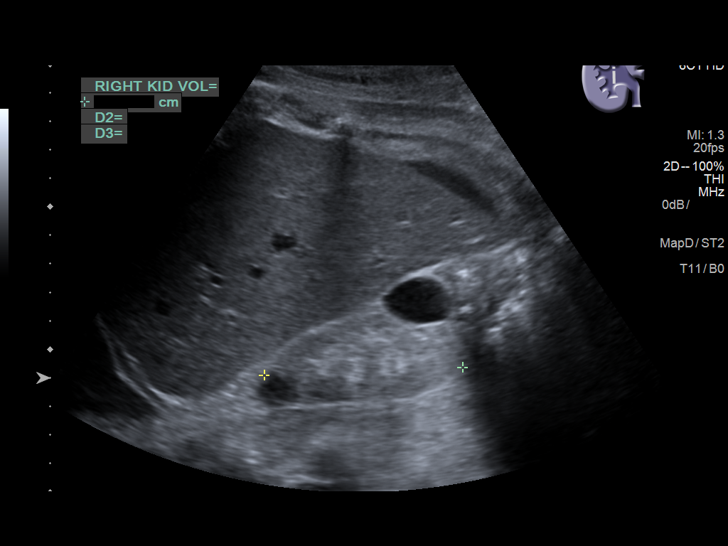
[im 6/36]
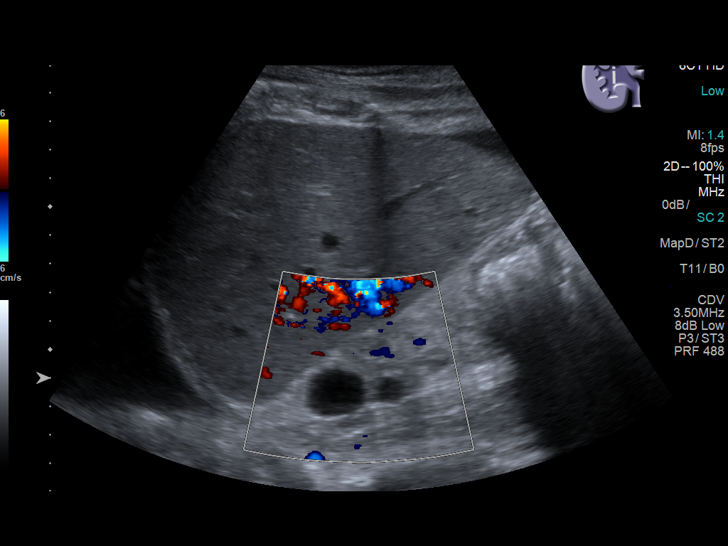
[im 9/36]
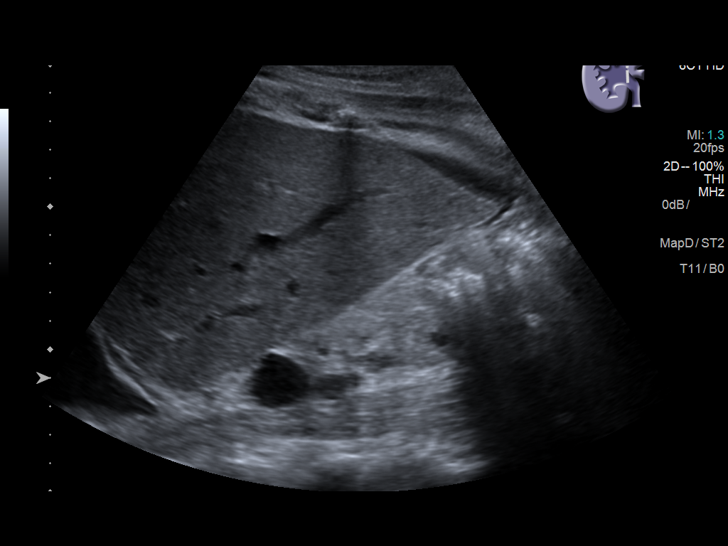
[im 12/36]
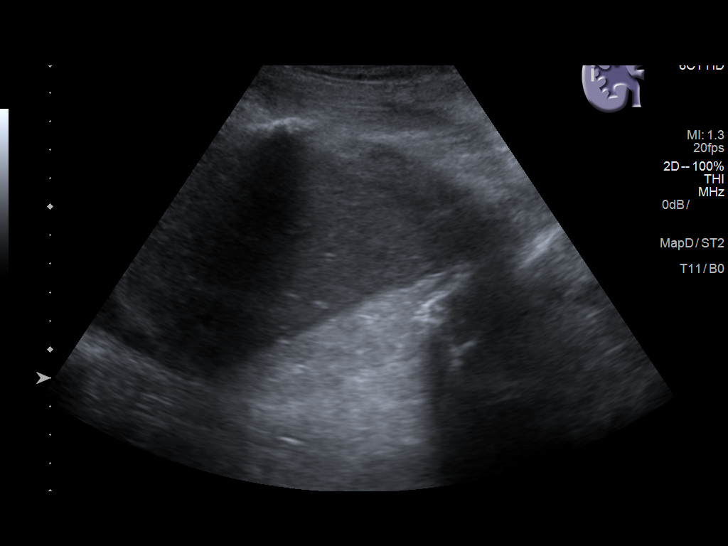
[im 14/36]
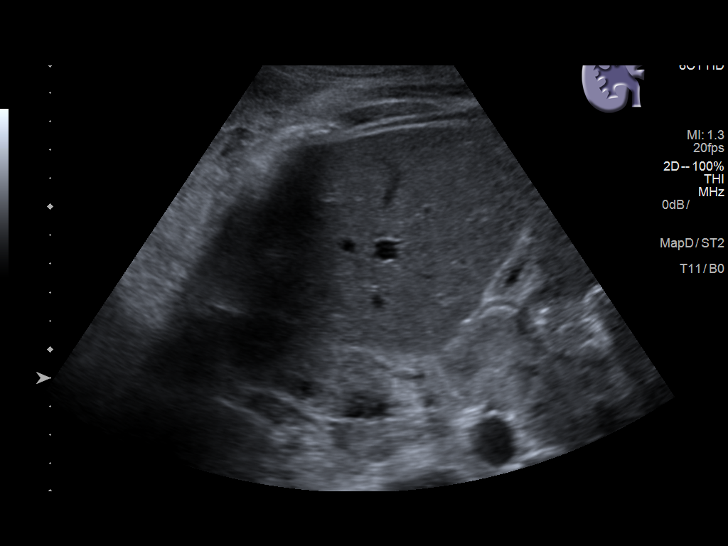
[im 17/36]
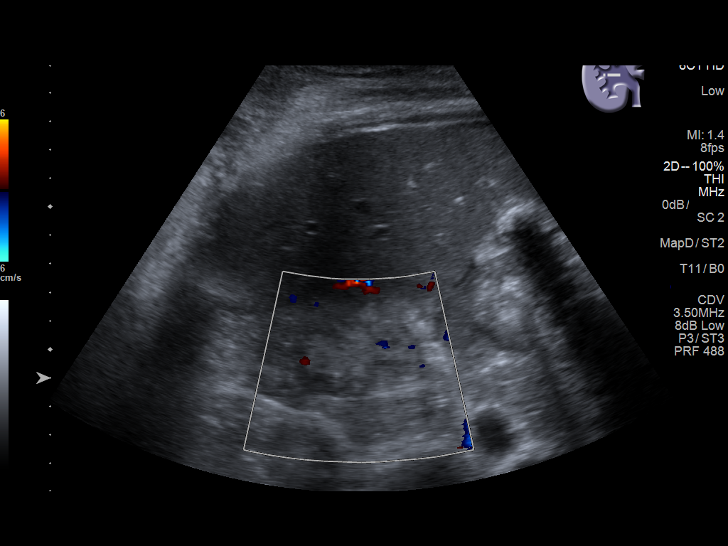
[im 19/36]
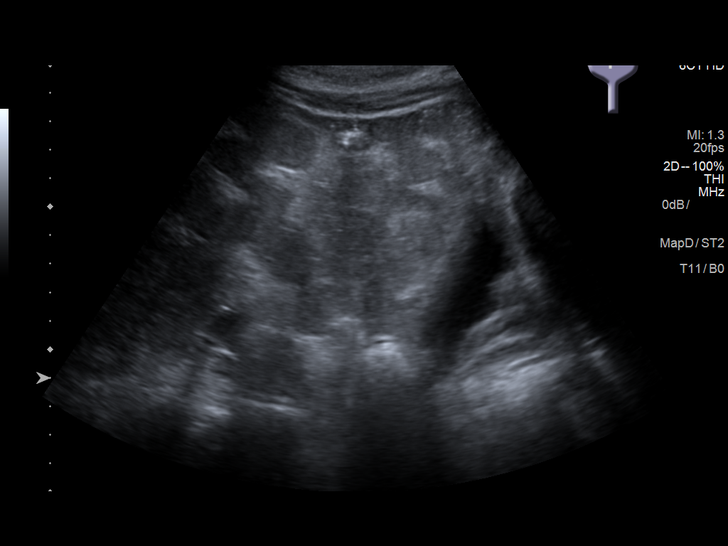
[im 22/36]
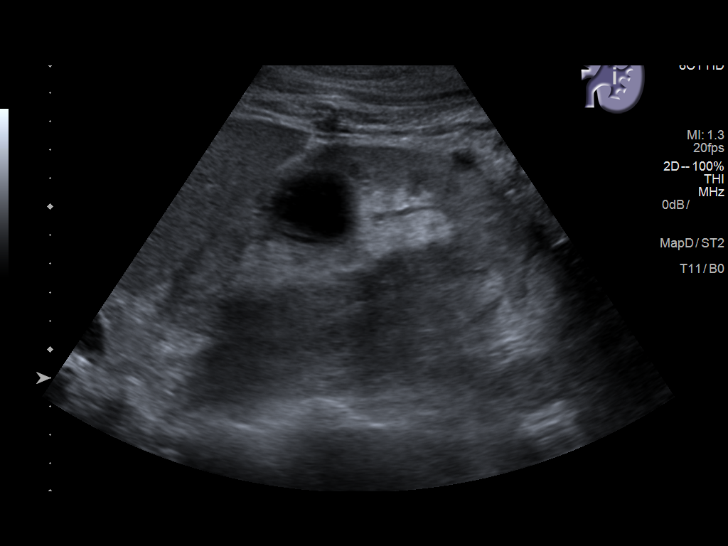
[im 24/36]
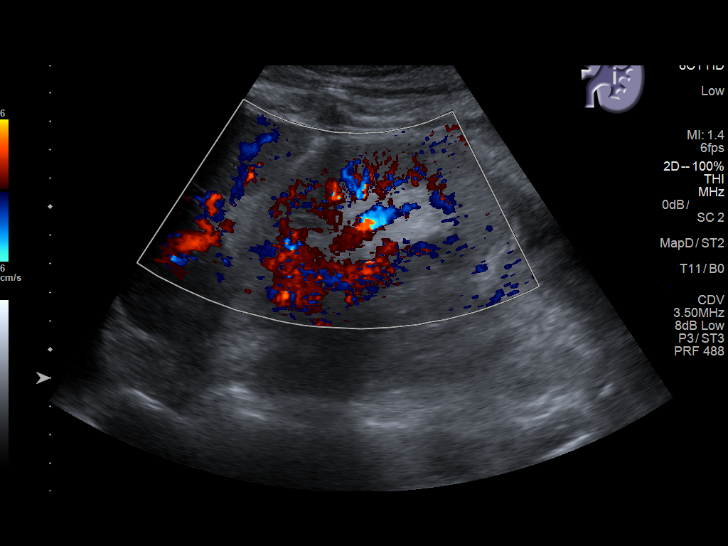
[im 27/36]
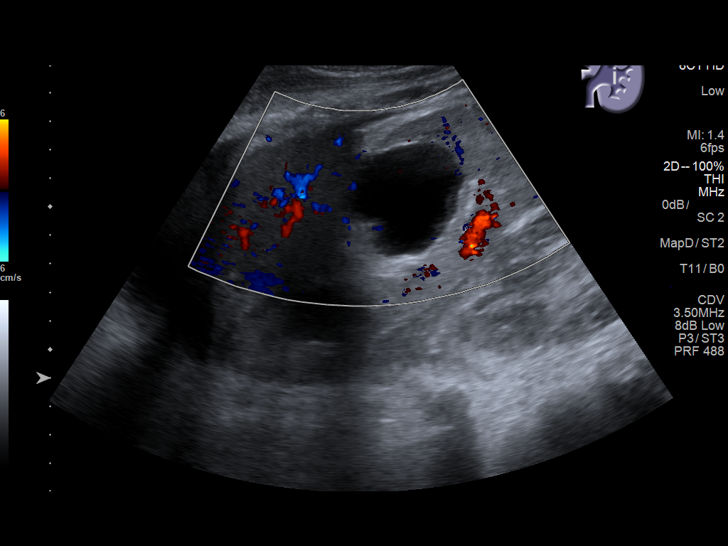
[im 30/36]
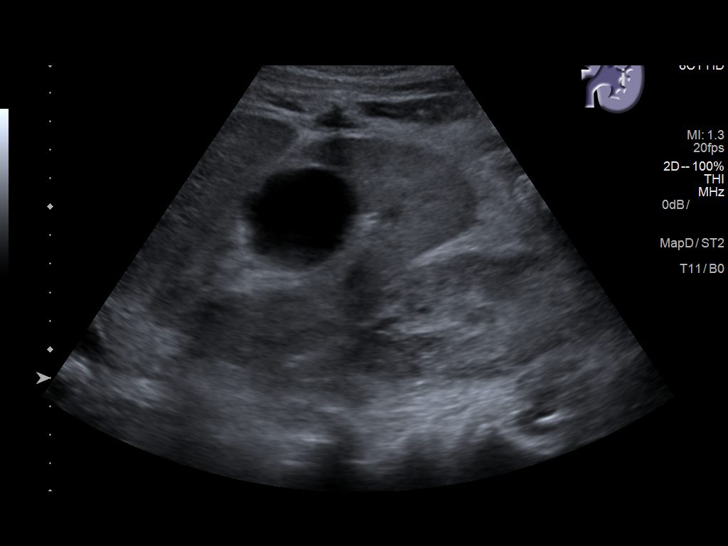
[im 33/36]
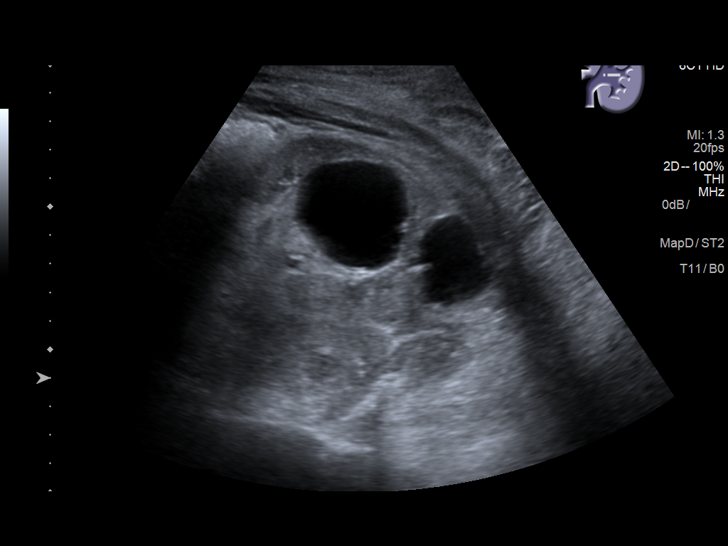
[im 36/36]
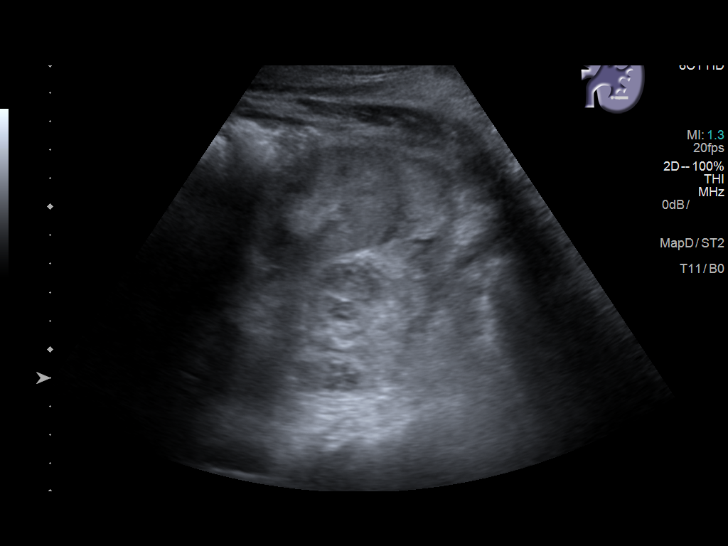

[14 of 25 positions shown; findings below may reference images not displayed]

FINDINGS: Right Kidney:

Renal measurements: 6.9 x 3.4 x 3.6 cm = volume: 46 mL. Marked
cortical thinning. Increased cortical echogenicity. Cysts are
identified at the upper pole 2.2 x 1.8 x 2.3 cm and lower pole 2.5 x
1.7 x 2.5 cm. No additional masses or hydronephrosis.

Left Kidney:

Renal measurements: 13.1 x 6.6 x 6.6 cm = volume: 303 mL. Normal
cortical thickness. Increased cortical echogenicity. Small cyst at
upper pole 3.5 x 3.0 x 2.7 cm. Additional peripelvic cyst mid kidney
3.9 x 3.6 x 3.9 cm. No additional masses or hydronephrosis. No
shadowing calculi.

Bladder:

Contains minimal urine, inadequately evaluated.

Incidentally noted minimal perihepatic ascites and small BILATERAL
pleural effusions.
IMPRESSION: Medical renal disease changes of both kidneys.

RIGHT renal cortical atrophy.

BILATERAL simple appearing renal cysts.

Small BILATERAL pleural effusions and minimal ascites.

## 2021-03-04 IMAGING — DX PORTABLE CHEST - 1 VIEW
1 series · 1 of 1 positions shown · non-contrast
Comparison: 11/26/2013

CLINICAL DATA: Shortness of breath, chest pain

EXAM:
PORTABLE CHEST 1 VIEW

[chest ap]
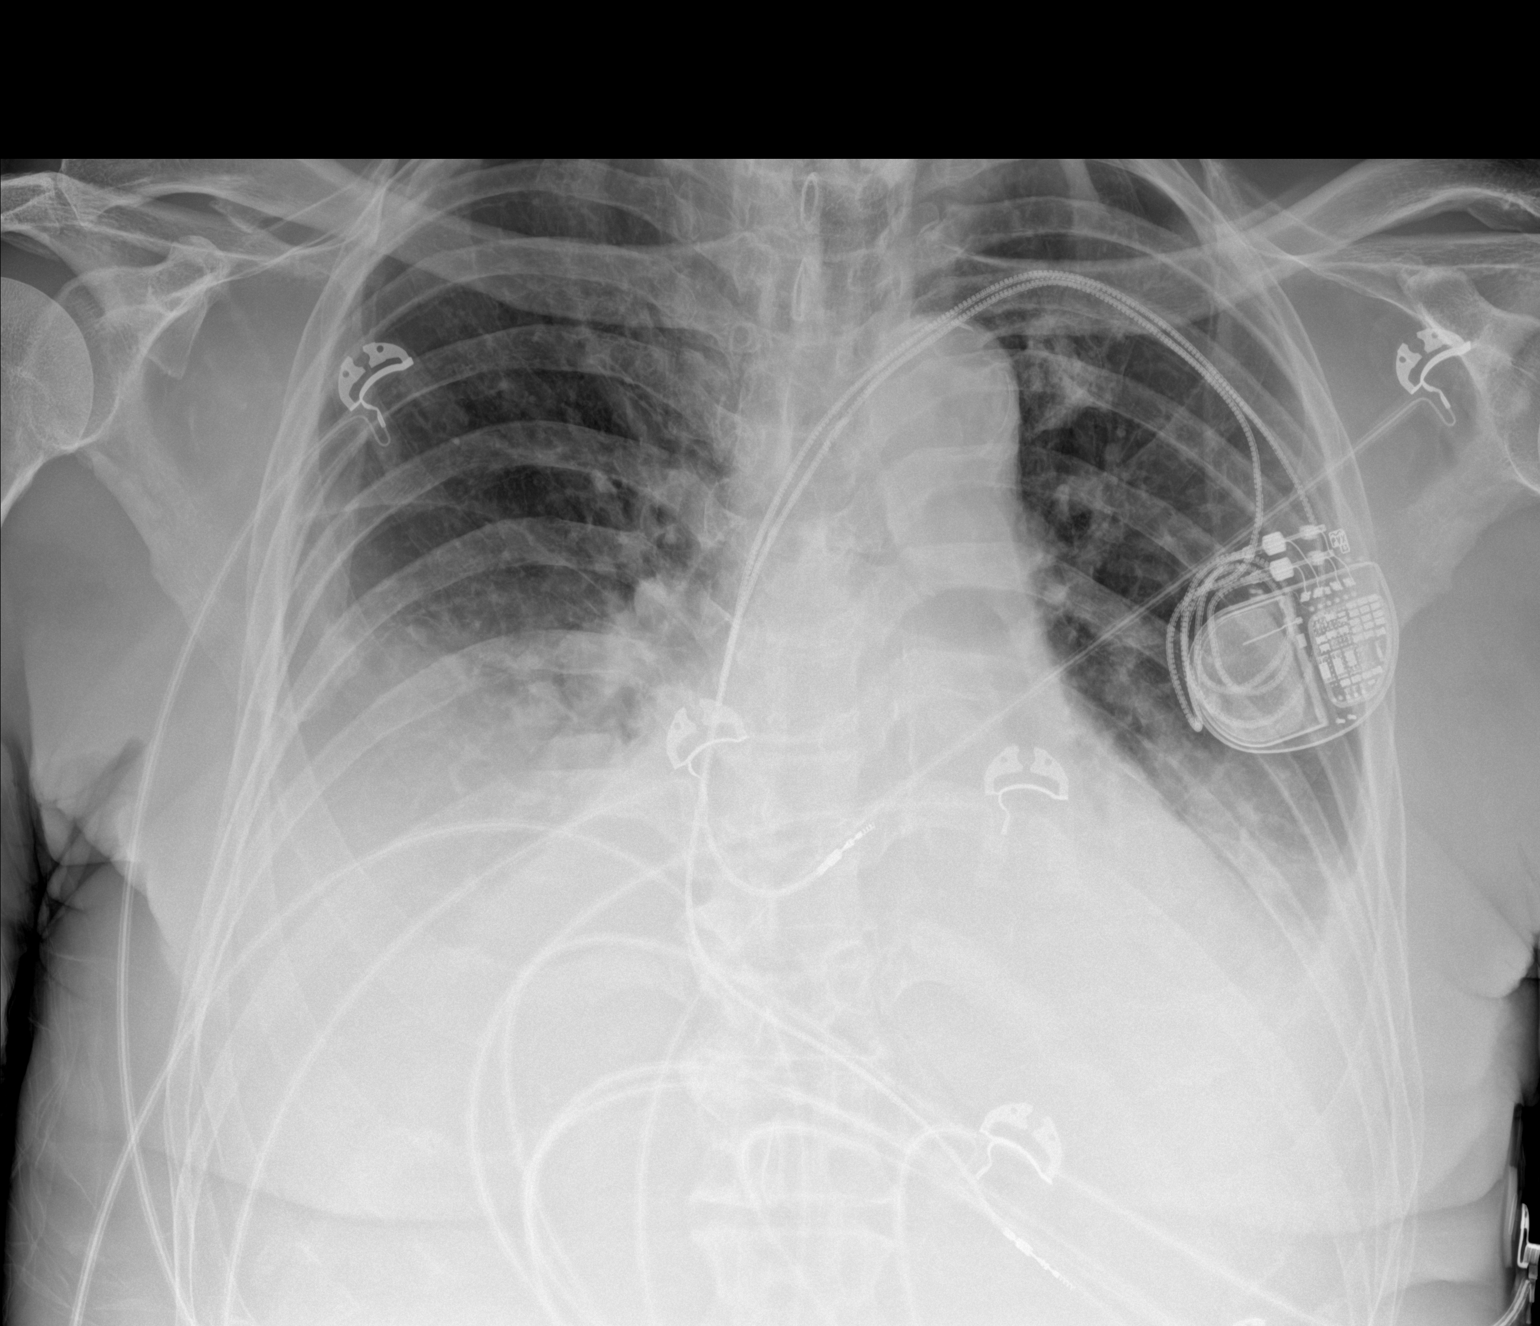

[1 of 1 positions shown; findings below may reference images not displayed]

FINDINGS: Small bilateral pleural effusions. Mild bilateral interstitial
thickening. No pneumothorax. Stable cardiomediastinal silhouette.
Dual lead cardiac pacemaker. No aggressive osseous lesion.
IMPRESSION: Mild pulmonary edema.

## 2021-03-04 IMAGING — US UPPER LEFT VENOUS EXTREMITY ULTRASOUND
1 series · 13 of 24 positions shown · non-contrast
Comparison: None.

CLINICAL DATA: Left upper extremity swelling for the past 3 days.
Evaluate for DVT.



[Series 1: upper left venous extremity ultrasound · 39 acquisitions, 13 frames shown]
[im 1/39]
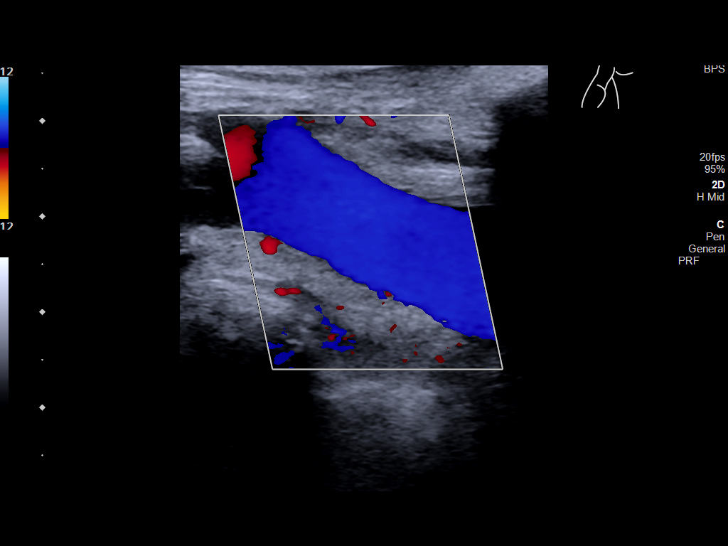
[im 4/39]
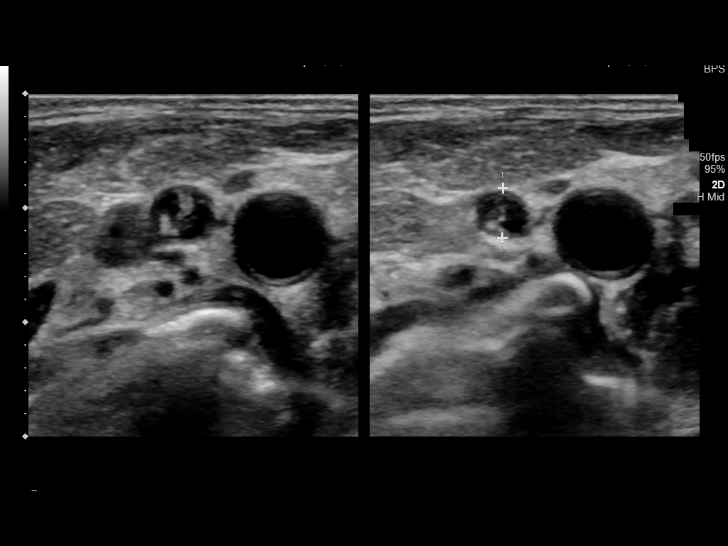
[im 7/39]
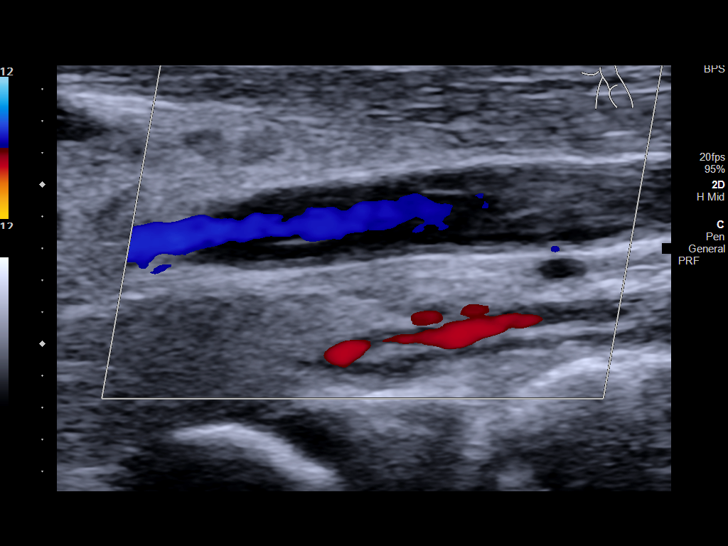
[im 10/39]
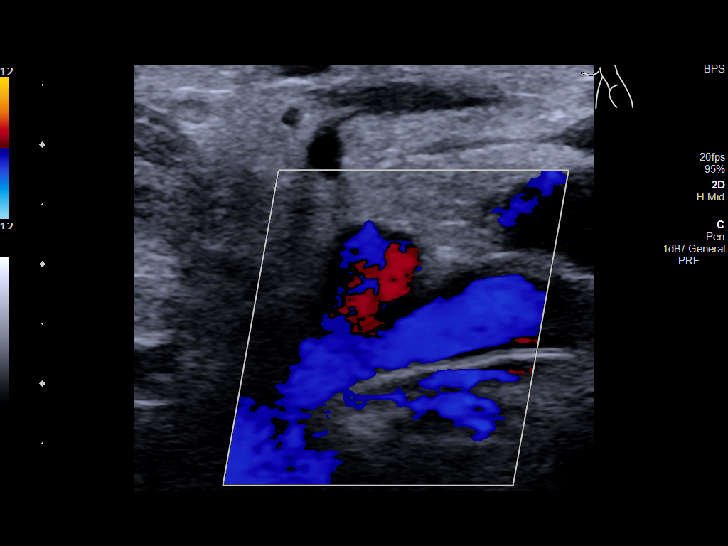
[im 14/39]
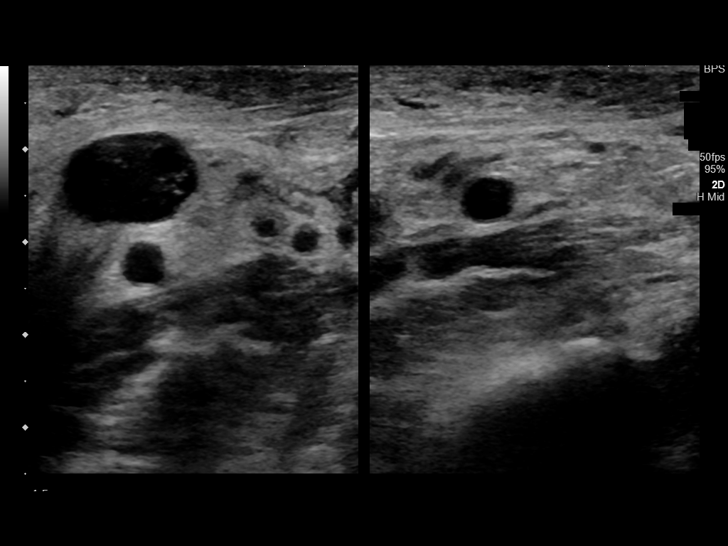
[im 17/39]
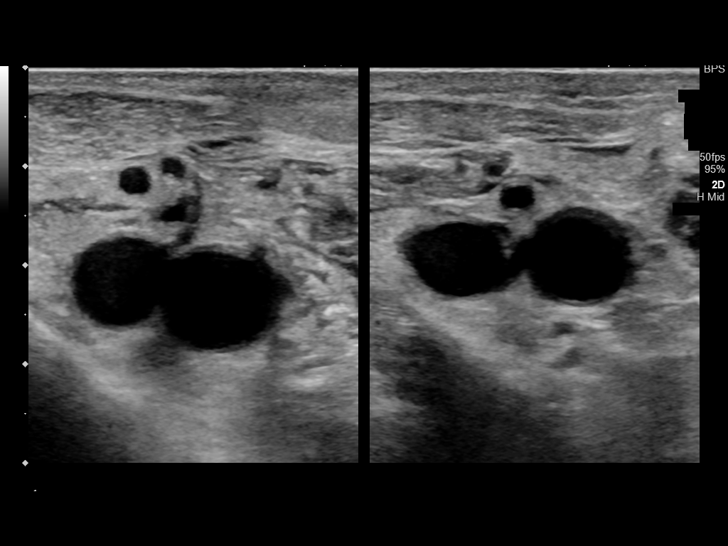
[im 22/39]
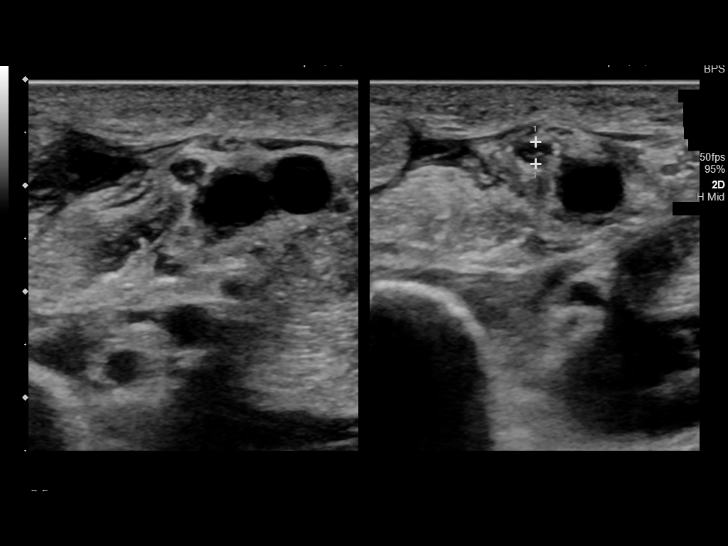
[im 24/39]
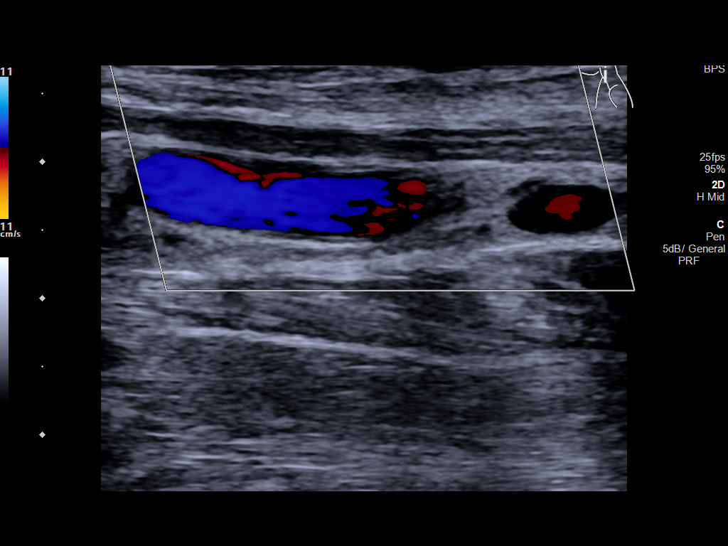
[im 27/39]
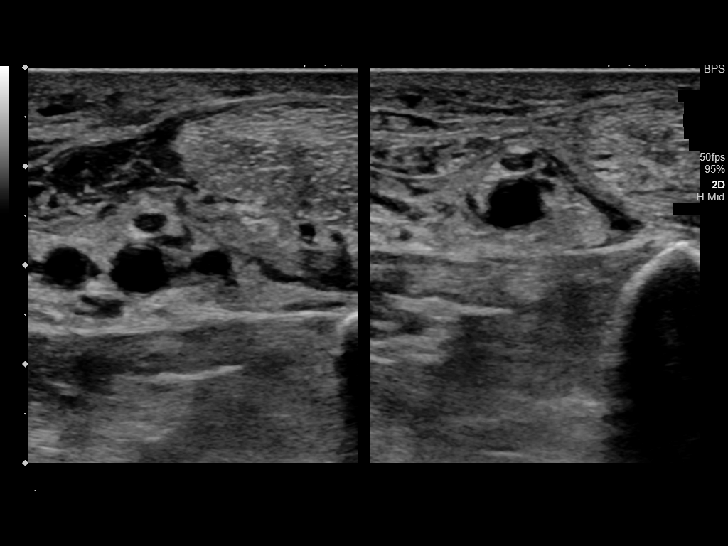
[im 30/39]
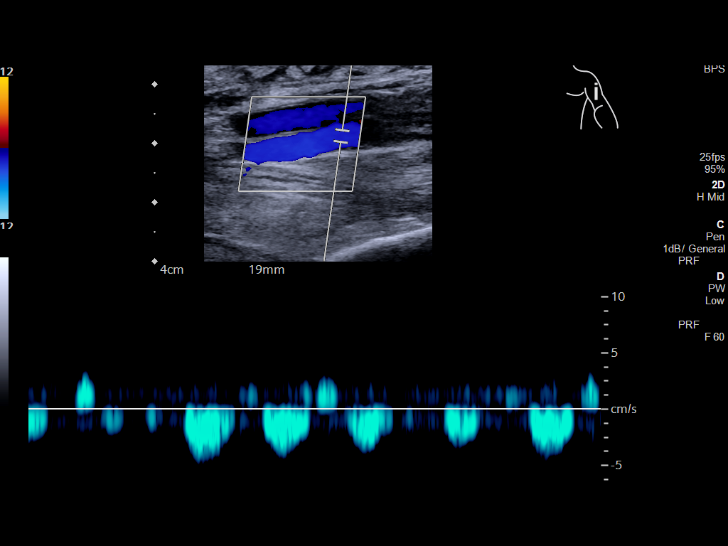
[im 34/39]
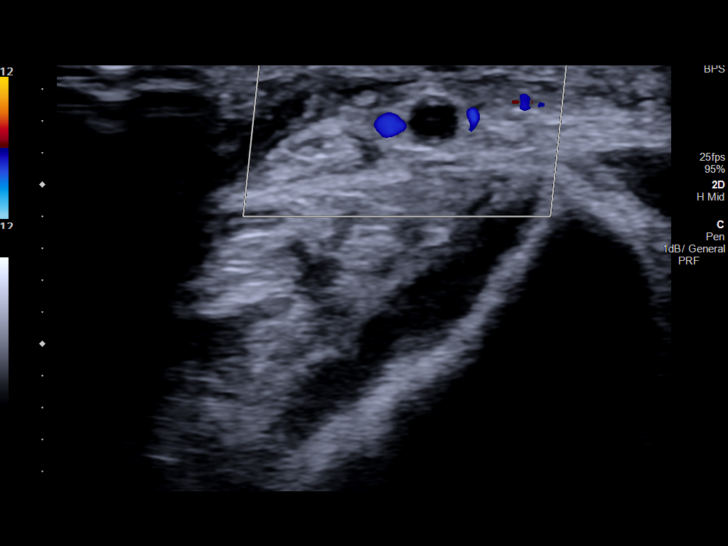
[im 37/39]
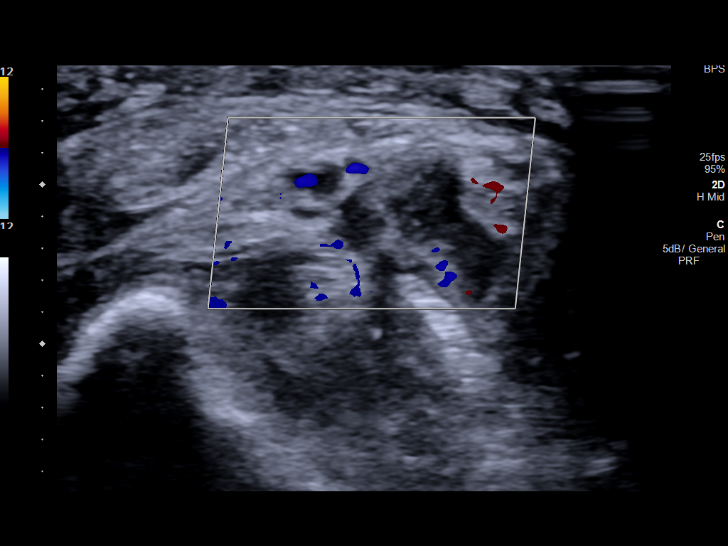
[im 39/39]
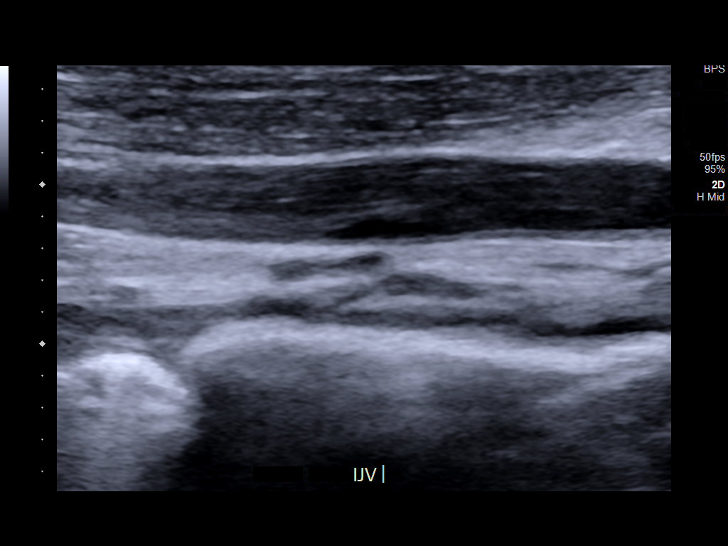

[13 of 24 positions shown; findings below may reference images not displayed]

FINDINGS: Contralateral Subclavian Vein: Respiratory phasicity is normal and
symmetric with the symptomatic side. No evidence of thrombus. Normal
compressibility.

Internal Jugular Vein: No evidence of thrombus. Normal
compressibility, respiratory phasicity and response to augmentation.

Subclavian Vein: No evidence of thrombus. Normal compressibility,
respiratory phasicity and response to augmentation.

Axillary Vein: No evidence of thrombus. Normal compressibility,
respiratory phasicity and response to augmentation.

Cephalic Vein: No evidence of thrombus. Normal compressibility,
respiratory phasicity and response to augmentation.

Basilic Vein: There is mixed echogenic occlusive thrombus seen
throughout the interrogated course of the slightly atrophic left
basilic vein (images 22 through 26).

Brachial Veins: No evidence of thrombus. Normal compressibility,
respiratory phasicity and response to augmentation.

Radial Veins: No evidence of thrombus. Normal compressibility,
respiratory phasicity and response to augmentation.

Ulnar Veins: No evidence of thrombus. Normal compressibility,
respiratory phasicity and response to augmentation.

Venous Reflux:  None visualized.

Other Findings: Moderate amount of subcutaneous edema is noted
throughout the left upper extremity.
IMPRESSION: 1. No evidence of DVT within the left lower extremity.
2. Age-indeterminate, though potentially chronic, occlusive SVT
throughout the interrogated course of the slightly atrophic left
basilic vein. Again, there is no extension of this age-indeterminate
SVT to the deep venous system of the left upper extremity.

## 2021-09-10 ENCOUNTER — Emergency Department: Payer: Medicare Other

## 2021-09-10 ENCOUNTER — Inpatient Hospital Stay
Admission: EM | Admit: 2021-09-10 | Discharge: 2021-09-19 | DRG: 871 | Disposition: E | Payer: Medicare Other | Attending: Internal Medicine | Admitting: Internal Medicine

## 2021-09-10 ENCOUNTER — Other Ambulatory Visit: Payer: Self-pay

## 2021-09-10 ENCOUNTER — Encounter: Payer: Self-pay | Admitting: Internal Medicine

## 2021-09-10 DIAGNOSIS — N179 Acute kidney failure, unspecified: Secondary | ICD-10-CM | POA: Diagnosis present

## 2021-09-10 DIAGNOSIS — D631 Anemia in chronic kidney disease: Secondary | ICD-10-CM | POA: Diagnosis present

## 2021-09-10 DIAGNOSIS — R6520 Severe sepsis without septic shock: Secondary | ICD-10-CM | POA: Diagnosis present

## 2021-09-10 DIAGNOSIS — Z833 Family history of diabetes mellitus: Secondary | ICD-10-CM

## 2021-09-10 DIAGNOSIS — I5022 Chronic systolic (congestive) heart failure: Secondary | ICD-10-CM | POA: Diagnosis present

## 2021-09-10 DIAGNOSIS — E119 Type 2 diabetes mellitus without complications: Secondary | ICD-10-CM

## 2021-09-10 DIAGNOSIS — Z7982 Long term (current) use of aspirin: Secondary | ICD-10-CM

## 2021-09-10 DIAGNOSIS — Z79899 Other long term (current) drug therapy: Secondary | ICD-10-CM

## 2021-09-10 DIAGNOSIS — J9621 Acute and chronic respiratory failure with hypoxia: Secondary | ICD-10-CM | POA: Diagnosis present

## 2021-09-10 DIAGNOSIS — E785 Hyperlipidemia, unspecified: Secondary | ICD-10-CM | POA: Diagnosis present

## 2021-09-10 DIAGNOSIS — I442 Atrioventricular block, complete: Secondary | ICD-10-CM | POA: Diagnosis present

## 2021-09-10 DIAGNOSIS — N184 Chronic kidney disease, stage 4 (severe): Secondary | ICD-10-CM | POA: Diagnosis present

## 2021-09-10 DIAGNOSIS — E1122 Type 2 diabetes mellitus with diabetic chronic kidney disease: Secondary | ICD-10-CM | POA: Diagnosis present

## 2021-09-10 DIAGNOSIS — Z20822 Contact with and (suspected) exposure to covid-19: Secondary | ICD-10-CM | POA: Diagnosis present

## 2021-09-10 DIAGNOSIS — R778 Other specified abnormalities of plasma proteins: Secondary | ICD-10-CM | POA: Diagnosis present

## 2021-09-10 DIAGNOSIS — Z950 Presence of cardiac pacemaker: Secondary | ICD-10-CM

## 2021-09-10 DIAGNOSIS — I248 Other forms of acute ischemic heart disease: Secondary | ICD-10-CM | POA: Diagnosis present

## 2021-09-10 DIAGNOSIS — R627 Adult failure to thrive: Secondary | ICD-10-CM | POA: Diagnosis present

## 2021-09-10 DIAGNOSIS — J9601 Acute respiratory failure with hypoxia: Secondary | ICD-10-CM | POA: Diagnosis present

## 2021-09-10 DIAGNOSIS — I5020 Unspecified systolic (congestive) heart failure: Secondary | ICD-10-CM | POA: Diagnosis present

## 2021-09-10 DIAGNOSIS — Z794 Long term (current) use of insulin: Secondary | ICD-10-CM | POA: Diagnosis not present

## 2021-09-10 DIAGNOSIS — E1159 Type 2 diabetes mellitus with other circulatory complications: Secondary | ICD-10-CM | POA: Diagnosis present

## 2021-09-10 DIAGNOSIS — I130 Hypertensive heart and chronic kidney disease with heart failure and stage 1 through stage 4 chronic kidney disease, or unspecified chronic kidney disease: Secondary | ICD-10-CM | POA: Diagnosis present

## 2021-09-10 DIAGNOSIS — R7989 Other specified abnormal findings of blood chemistry: Secondary | ICD-10-CM | POA: Diagnosis present

## 2021-09-10 DIAGNOSIS — J90 Pleural effusion, not elsewhere classified: Secondary | ICD-10-CM | POA: Diagnosis present

## 2021-09-10 DIAGNOSIS — Z87891 Personal history of nicotine dependence: Secondary | ICD-10-CM

## 2021-09-10 DIAGNOSIS — E43 Unspecified severe protein-calorie malnutrition: Secondary | ICD-10-CM | POA: Diagnosis present

## 2021-09-10 DIAGNOSIS — Z681 Body mass index (BMI) 19 or less, adult: Secondary | ICD-10-CM | POA: Diagnosis not present

## 2021-09-10 DIAGNOSIS — A419 Sepsis, unspecified organism: Principal | ICD-10-CM | POA: Diagnosis present

## 2021-09-10 DIAGNOSIS — Z66 Do not resuscitate: Secondary | ICD-10-CM | POA: Diagnosis present

## 2021-09-10 DIAGNOSIS — J690 Pneumonitis due to inhalation of food and vomit: Secondary | ICD-10-CM | POA: Diagnosis present

## 2021-09-10 LAB — BLOOD GAS, VENOUS
Acid-Base Excess: 3.9 mmol/L — ABNORMAL HIGH (ref 0.0–2.0)
Bicarbonate: 30.2 mmol/L — ABNORMAL HIGH (ref 20.0–28.0)
O2 Saturation: 83.7 %
Patient temperature: 37
pCO2, Ven: 51 mmHg (ref 44–60)
pH, Ven: 7.38 (ref 7.25–7.43)
pO2, Ven: 57 mmHg — ABNORMAL HIGH (ref 32–45)

## 2021-09-10 LAB — RESP PANEL BY RT-PCR (FLU A&B, COVID) ARPGX2
Influenza A by PCR: NEGATIVE
Influenza B by PCR: NEGATIVE
SARS Coronavirus 2 by RT PCR: NEGATIVE

## 2021-09-10 LAB — COMPREHENSIVE METABOLIC PANEL
ALT: 16 U/L (ref 0–44)
AST: 23 U/L (ref 15–41)
Albumin: 2.7 g/dL — ABNORMAL LOW (ref 3.5–5.0)
Alkaline Phosphatase: 102 U/L (ref 38–126)
Anion gap: 11 (ref 5–15)
BUN: 55 mg/dL — ABNORMAL HIGH (ref 8–23)
CO2: 25 mmol/L (ref 22–32)
Calcium: 8.7 mg/dL — ABNORMAL LOW (ref 8.9–10.3)
Chloride: 105 mmol/L (ref 98–111)
Creatinine, Ser: 2.84 mg/dL — ABNORMAL HIGH (ref 0.61–1.24)
GFR, Estimated: 22 mL/min — ABNORMAL LOW (ref >60)
Glucose, Bld: 217 mg/dL — ABNORMAL HIGH (ref 70–99)
Potassium: 4.9 mmol/L (ref 3.5–5.1)
Sodium: 141 mmol/L (ref 135–145)
Total Bilirubin: 0.8 mg/dL (ref 0.3–1.2)
Total Protein: 7 g/dL (ref 6.5–8.1)

## 2021-09-10 LAB — CBC WITH DIFFERENTIAL/PLATELET
Abs Immature Granulocytes: 0.03 10*3/uL (ref 0.00–0.07)
Basophils Absolute: 0 10*3/uL (ref 0.0–0.1)
Basophils Relative: 0 %
Eosinophils Absolute: 0.1 10*3/uL (ref 0.0–0.5)
Eosinophils Relative: 1 %
HCT: 28.6 % — ABNORMAL LOW (ref 39.0–52.0)
Hemoglobin: 8.6 g/dL — ABNORMAL LOW (ref 13.0–17.0)
Immature Granulocytes: 0 %
Lymphocytes Relative: 5 %
Lymphs Abs: 0.4 10*3/uL — ABNORMAL LOW (ref 0.7–4.0)
MCH: 29.8 pg (ref 26.0–34.0)
MCHC: 30.1 g/dL (ref 30.0–36.0)
MCV: 99 fL (ref 80.0–100.0)
Monocytes Absolute: 0.3 10*3/uL (ref 0.1–1.0)
Monocytes Relative: 4 %
Neutro Abs: 7.4 10*3/uL (ref 1.7–7.7)
Neutrophils Relative %: 90 %
Platelets: 268 10*3/uL (ref 150–400)
RBC: 2.89 MIL/uL — ABNORMAL LOW (ref 4.22–5.81)
RDW: 15.9 % — ABNORMAL HIGH (ref 11.5–15.5)
WBC: 8.2 10*3/uL (ref 4.0–10.5)
nRBC: 0 % (ref 0.0–0.2)

## 2021-09-10 LAB — TROPONIN I (HIGH SENSITIVITY)
Troponin I (High Sensitivity): 35 ng/L — ABNORMAL HIGH (ref <18)
Troponin I (High Sensitivity): 37 ng/L — ABNORMAL HIGH (ref <18)

## 2021-09-10 LAB — BRAIN NATRIURETIC PEPTIDE: B Natriuretic Peptide: 1974.6 pg/mL — ABNORMAL HIGH (ref 0.0–100.0)

## 2021-09-10 LAB — PROCALCITONIN: Procalcitonin: 0.43 ng/mL

## 2021-09-10 LAB — LACTIC ACID, PLASMA: Lactic Acid, Venous: 3.2 mmol/L (ref 0.5–1.9)

## 2021-09-10 LAB — MAGNESIUM: Magnesium: 2.3 mg/dL (ref 1.7–2.4)

## 2021-09-10 LAB — CBG MONITORING, ED: Glucose-Capillary: 198 mg/dL — ABNORMAL HIGH (ref 70–99)

## 2021-09-10 MED ORDER — SODIUM CHLORIDE 0.9 % IV SOLN: 2.0000 g | INTRAVENOUS | Status: DC

## 2021-09-10 MED ORDER — LACTATED RINGERS IV BOLUS: 1000.0000 mL | Freq: Once | INTRAVENOUS | Status: DC

## 2021-09-10 MED ORDER — AZITHROMYCIN 500 MG IV SOLR
500.0000 mg | Freq: Once | INTRAVENOUS | Status: AC
  Administered 2021-09-10: 500 mg via INTRAVENOUS
  Filled 2021-09-10: qty 5

## 2021-09-10 MED ORDER — LACTATED RINGERS IV BOLUS
500.0000 mL | Freq: Once | INTRAVENOUS | Status: AC
  Administered 2021-09-10: 500 mL via INTRAVENOUS

## 2021-09-10 MED ORDER — SODIUM CHLORIDE 0.9 % IV SOLN: 500.0000 mg | INTRAVENOUS | Status: DC

## 2021-09-10 MED ORDER — INSULIN ASPART 100 UNIT/ML IJ SOLN: 0.0000 [IU] | Freq: Three times a day (TID) | INTRAMUSCULAR | Status: DC

## 2021-09-10 MED ORDER — VANCOMYCIN HCL IN DEXTROSE 1-5 GM/200ML-% IV SOLN: 1000.0000 mg | Freq: Once | INTRAVENOUS | Status: DC

## 2021-09-10 MED ORDER — LABETALOL HCL 5 MG/ML IV SOLN
5.0000 mg | INTRAVENOUS | Status: DC | PRN
  Administered 2021-09-10: 5 mg via INTRAVENOUS
  Filled 2021-09-10: qty 4

## 2021-09-10 MED ORDER — ACETAMINOPHEN 325 MG PO TABS: 650.0000 mg | ORAL_TABLET | Freq: Four times a day (QID) | ORAL | Status: DC | PRN

## 2021-09-10 MED ORDER — ONDANSETRON HCL 4 MG PO TABS: 4.0000 mg | ORAL_TABLET | Freq: Four times a day (QID) | ORAL | Status: DC | PRN

## 2021-09-10 MED ORDER — HEPARIN SODIUM (PORCINE) 5000 UNIT/ML IJ SOLN: 5000.0000 [IU] | Freq: Three times a day (TID) | INTRAMUSCULAR | Status: DC

## 2021-09-10 MED ORDER — ONDANSETRON HCL 4 MG/2ML IJ SOLN: 4.0000 mg | Freq: Four times a day (QID) | INTRAMUSCULAR | Status: DC | PRN

## 2021-09-10 MED ORDER — INSULIN ASPART 100 UNIT/ML IJ SOLN: 0.0000 [IU] | Freq: Every day | INTRAMUSCULAR | Status: DC

## 2021-09-10 MED ORDER — ACETAMINOPHEN 650 MG RE SUPP: 650.0000 mg | Freq: Four times a day (QID) | RECTAL | Status: DC | PRN

## 2021-09-10 MED ORDER — POLYETHYLENE GLYCOL 3350 17 G PO PACK: 17.0000 g | PACK | Freq: Every day | ORAL | Status: DC | PRN

## 2021-09-10 MED ORDER — ASPIRIN EC 81 MG PO TBEC: 81.0000 mg | DELAYED_RELEASE_TABLET | ORAL | Status: DC

## 2021-09-10 MED ORDER — SODIUM CHLORIDE 0.9 % IV SOLN
2.0000 g | Freq: Once | INTRAVENOUS | Status: AC
  Administered 2021-09-10: 2 g via INTRAVENOUS
  Filled 2021-09-10: qty 12.5

## 2021-09-10 MED ORDER — VANCOMYCIN HCL 500 MG/100ML IV SOLN: 500.0000 mg | INTRAVENOUS | Status: DC

## 2021-09-10 NOTE — ED Notes (Signed)
[facility] will fax over pts DNR ?

## 2021-09-10 NOTE — Assessment & Plan Note (Addendum)
-   Right greater than left ?- Ultrasound-guided thoracentesis, right-sided, with labs ordered ?- Avoid aggressive fluid resuscitation ?

## 2021-09-10 NOTE — ED Notes (Addendum)
Paged respiratory for C-pap per Dr. Tobie Poet. ?

## 2021-09-10 NOTE — Assessment & Plan Note (Signed)
-   Insulin SSI with agents coverage ordered ?- Goal inpatient blood glucose levels 140-180 ?

## 2021-09-10 NOTE — Assessment & Plan Note (Signed)
-   Patient is a candidate for comfort measures ?

## 2021-09-10 NOTE — H&P (Addendum)
?History and Physical  ? ?Kevin Gibson SPQ:[id number] DOB: Sep 21, 1940 DOA: 09/05/2021 ? ?PCP: Kirk Ruths, MD ?Outpatient Specialists: Dr. Sharol Roussel, orthopedic surgeon ?Kevin Gibson coming from: [facility] ? ?I have personally briefly reviewed Kevin Gibson's old medical records in [place]. ? ?Chief Concern: Shortness of breath ? 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 ? ?Initial vitals in the emergency department showed temperature of 97.9, respiration rate of 26, heart rate of 90, blood pressure 121/96, SPO2 of 88% on CPAP which improved to 96% on high flow nasal cannula. ?Serum sodium was 141, potassium 4.9, chloride 105, bicarb 25, BUN 55, serum creatinine of 2.84, nonfasting blood glucose 217, GFR of 22.  WBC 8.2, hemoglobin 8.6, platelets of 268. ? ?BNP was elevated 1974.6.  High sensitive troponin was 35.  Procalcitonin was 0.43. ? ?VBG was 7.3 8/51/57 ? ?ED treatment: Kevin Gibson improved on CPAP, and oxygen supplementation was changed to high flow nasal cannula with good tolerance.  Azithromycin 500 mg IV one-time dose, ceftriaxone 2 g IV, LR 500 mL bolus one-time dose. ? ?At bedside Kevin Gibson is exhibiting increased work of breathing, tachypneic.  SPO2 is satting at 87 to 88% on high flow nasal cannula, with increased respiration rate.  Kevin Gibson is maintaining MAP greater than 65 at this time. ? ?Daughter states that she was called by [facility] stating that Kevin Gibson was having difficulty breathing and that he had vomited several times. ? ?Social history: Kevin Gibson is currently living at [facility] facility ? ?Vaccination history: Kevin Gibson is vaccinated for COVID-19 with Moderna on 08/29/2019,  09/26/2019, 04/09/2020.  Kevin Gibson has received pneumococcal vaccine on 08/05/2019.  Kevin Gibson has received Binax COVID-vaccine booster on 08/26/2021. ? ?ROS: Unable to complete due to acuity the Kevin Gibson presentation ? ?ED Course: Discussed with emergency medicine provider, Kevin Gibson requiring hospitalization for chief concerns of respiratory failure. ? ?Assessment/Plan ? ?Principal Problem: ?  Acute respiratory failure with hypoxia ([place]) ?Active Problems: ?  Atrioventricular block, complete ([place]) ?  Pacemaker-medtronic  MRI compatible ?  Hypertension complicating diabetes ([place]) ?  Diabetes mellitus with chronic kidney disease ([place]) ?  Insulin dependent type 2 diabetes mellitus ([place]) ?  Bilateral pleural effusion ?  Severe sepsis ([place]) ?  CKD (chronic kidney disease) stage 4, GFR 15-29 ml/min (HCC) ?  Heart failure with reduced ejection fraction ([place]) ?  Elevated troponin ?  Failure to thrive in adult ?  Protein-calorie malnutrition, severe ([place]) ?  AKI (acute kidney injury) ([place]) ?  ?Assessment and Plan: ? ?* Acute respiratory failure with hypoxia (HCC) ?- Continue high flow nasal cannula to maintain SPO2 greater than 92% ?- Etiology is multifactorial including multifocal pneumonia and bilateral pleural effusion ?- Admit to stepdown, inpatient ? ?Protein-calorie malnutrition, severe ([place]) ?- Kevin Gibson is a candidate for comfort measures ? ?Failure to thrive in adult ?- Kevin Gibson is candidate for comfort measures ?- I have discussed comfort measures with daughter, she does not appear to be ready at this time ? ?Elevated troponin ?- Presumed multifactorial including secondary to heart failure reduced ejection fraction, versus sepsis, pneumonia ?- Suspect demand ischemia ? ?Heart failure with reduced ejection fraction ([place]) ?- Not compensated at this time ?- Strict I's and O's ? ?CKD (chronic kidney disease) stage 4, GFR 15-29 ml/min (HCC) ?- Possible elevated creatinine  versus AKI versus progression of CKD 4 ?- BMP in the  a.m. ? ?Severe sepsis ([place]) ?- Kevin Gibson met severe sepsis with increased respiration rate, increased heart rate, source of infection is pneumonia, and respiratory failure ?- Kevin Gibson has inherent risk of MRSA and Pseudomonas infection ?- Increase antibiotic coverage for MRSA and Pseudomonas with Vanco and cefepime ?- Continue azithromycin daily ?- Blood cultures x2 ordered, lactic acid for admission, procalcitonin a.m. labs ordered ? ?Bilateral pleural effusion ?- Right greater than left ?- Ultrasound-guided thoracentesis, right-sided, with labs ordered ?- Avoid aggressive fluid resuscitation ? ?Insulin dependent type 2 diabetes mellitus ([place]) ?- Insulin SSI with agents coverage ordered ?- Goal inpatient blood glucose levels 140-180 ? ?Acute on chronic respiratory failure with hypoxia (HCC)-resolved as of 09/22/2021 ?- Continue high flow nasal cannula to maintain SPO2 greater than 92% ?- Etiology is multifactorial including multifocal pneumonia and bilateral pleural effusion ?- Admit to stepdown, inpatient ? ?Chart reviewed.  ? ?Hospitalization from 01/26/2021 to 02/02/2021: For decompensated heart failure with reduced ejection fraction.  He had manifested with worsening voiding overload with noted lower extremity edema, weight gain since last admission, left greater than right pulmonary edema, possible underlying component of amyloidosis.  It was presumed that his laceration is secondary to precipitated by discontinuation of diuretics during last admission in setting of acute kidney injury.  BNP was elevated to 1356 on arrival.  He was successfully diuresed with interval symptomatic improvement and started on p.o. Lasix 20 mg daily maintenance regimen.  Home hydralazine and Isordil dosages were increased to 100 mg and 20 mg respectively.  He was started on Jardiance.  He was discharged to SNF facility. ? ?DVT prophylaxis: Heparin 5000 units subcutaneous ?Code Status: DNR/DNI ?Diet: N.p.o. except for sips of meds and ice  chips ?Family Communication: Updated daughter at bedside ?Disposition Plan: Pending clinical course, guarded prognosis, anticipate more than 2 night stay ?Consults called: Palliative care consult ?Admission status: Inpatient, stepdown ? ?Past Medical History:  ?Diagnosis Date  ? Chronic kidney disease   ? Diabetes mellitus without complication ([place])   ? Heart murmur   ? Hyperlipidemia   ? Hypertension   ? Pacemaker   ? RBBB   ? Syncope and collapse   ? Third degree heart block ([place])   ? ?Past Surgical History:  ?Procedure Laterality Date  ? PACEMAKER INSERTION    ? PERMANENT PACEMAKER INSERTION N/A 11/26/2013  ? Procedure: PERMANENT PACEMAKER INSERTION;  Surgeon: Deboraha Sprang, MD;  Location: [facility] CATH LAB;  Service: Cardiovascular;  Laterality: N/A;  ? ?Social History:  reports that he quit smoking about 21 years ago. His smoking use included cigarettes. He has never used smokeless tobacco. He reports that he does not drink alcohol and does not use drugs. ? ?No Known Allergies ?Family History  ?Problem Relation Age of Onset  ? Diabetes Mother   ? ?Family history: Family history reviewed and not pertinent. ? ?Prior to Admission medications   ?Medication Sig Start Date End Date Taking? Authorizing Provider  ?aspirin EC 81 MG tablet Take 81 mg by mouth every other day.    [provider]  ?carvedilol (COREG) 12.5 MG tablet Take 1 tablet (12.5 mg total) by mouth 2 (two) times daily with a meal. 11/15/18   Fritzi Mandes, MD  ?hydrALAZINE (APRESOLINE) 25 MG tablet Take 1 tablet (25 mg total) by mouth 2 (two) times daily. 11/15/18   Fritzi Mandes, MD  ?insulin aspart protamine- aspart (NOVOLOG MIX 70/30) (70-30) 100 UNIT/ML injection Inject  0.15 mLs (15 Units total) into the skin 2 (two) times daily with a meal. 11/15/18   Fritzi Mandes, MD  ?lisinopril (ZESTRIL) 2.5 MG tablet Take 1 tablet (2.5 mg total) by mouth daily. 11/15/18   Fritzi Mandes, MD  ?torsemide (DEMADEX) 20 MG tablet Take 2 tablets (40 mg total) by mouth daily.  11/15/18   Fritzi Mandes, MD  ? ?Physical Exam: ?Vitals:  ? 09/18/2021 2245 09/07/2021 2246 08/26/2021 2318 08/28/2021 2334  ?BP: 91/67 98/70 (!) 75/62 (!) 51/11  ?Pulse: 80 80  (!) 0  ?Resp: (!) 28 (!) 29  (!) 0  ?Temp:

## 2021-09-10 NOTE — Assessment & Plan Note (Signed)
-   Presumed multifactorial including secondary to heart failure reduced ejection fraction, versus sepsis, pneumonia ?- Suspect demand ischemia ?

## 2021-09-10 NOTE — Discharge Summary (Signed)
? ?DEATH SUMMARY  ? ?Patient Details  ?Name: Kevin Gibson ?MRN: [id number] ?DOB: 24-Feb-1941 ?TLX:[id number], Kevin Cornfield, MD ? ?Admission/Discharge Information  ? ?Admit Date:  Sep 19, 2021  ?Date of Death:    ?Time of Death:    ?Length of Stay: 0  ? ?Principle Cause of death: Bilateral pleural effusion causing acute on chronic respiratory failure with hypoxia. ? ?Physical exam ? ?Patient unresponsive to noxious stimuli. ?Pupils fixed and dilated. ?No spontaneous respirations.  ?No auscultation heart sounds.  ?No auscultated respiration. ?No auscultated bowel sounds.  ?No palpable pulse in the right radial or carotid.  ?Patient pronounced deceased at 2335/08/07 on 09/19/21 ? ?Hospital Diagnoses: ?Principal Problem: ?  Acute on chronic respiratory failure with hypoxia ([place]) ?Active Problems: ?  Atrioventricular block, complete ([place]) ?  Pacemaker-medtronic  MRI compatible ?  Hypertension complicating diabetes ([place]) ?  Diabetes mellitus with chronic kidney disease ([place]) ?  Insulin dependent type 2 diabetes mellitus ([place]) ?  Bilateral pleural effusion ?  Severe sepsis ([place]) ?  CKD (chronic kidney disease) stage 4, GFR 15-29 ml/min (HCC) ?  Heart failure with reduced ejection fraction ([place]) ?  Elevated troponin ?  Failure to thrive in adult ?  Protein-calorie malnutrition, severe ([place]) ? ?Hospital Course: ?Mr. 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 ? ?Initial vitals in the emergency department showed temperature of 97.9, respiration rate of 26, heart rate of 90, blood pressure 121/96, SPO2 of 88% on CPAP which improved to 96% on high flow nasal cannula. ?Serum sodium was 141, potassium 4.9, chloride 105, bicarb 25, BUN 55,  serum creatinine of 2.84, nonfasting blood glucose 217, GFR of 22.  WBC 8.2, hemoglobin 8.6, platelets of 268. ? ?BNP was elevated 1974.6.  High sensitive troponin was 35.  Procalcitonin was 0.43. ? ?VBG was 7.3 8/51/57 ? ?ED treatment: Patient improved on CPAP, and oxygen supplementation was changed to high flow nasal cannula with good tolerance.  Azithromycin 500 mg IV one-time dose, ceftriaxone 2 g IV, LR 500 mL bolus one-time dose. ? ?Assessment and Plan: ? ?* Acute on chronic respiratory failure with hypoxia (HCC) ?- Continue high flow nasal cannula to maintain SPO2 greater than 92% ?- Etiology is multifactorial including multifocal pneumonia and bilateral pleural effusion ?- Admit to stepdown, inpatient ? ?Protein-calorie malnutrition, severe ([place]) ?- Patient is a candidate for comfort measures ? ?Failure to thrive in adult ?- Patient is candidate for comfort measures ?- I have discussed comfort measures with daughter, she does not appear to be ready at this time ? ?Elevated troponin ?- Presumed multifactorial including secondary to heart failure reduced ejection fraction, versus sepsis, pneumonia ?- Suspect demand ischemia ? ?Heart failure with reduced ejection fraction ([place]) ?- Not compensated at this time ?- Strict I's and O's ? ?CKD (chronic kidney disease) stage 4, GFR 15-29 ml/min (HCC) ?- Possible elevated creatinine versus AKI versus progression of CKD 4 ?- BMP in the a.m. ? ?Severe sepsis ([place]) ?- Patient met severe sepsis with increased respiration rate, increased heart rate, source of infection is pneumonia, and respiratory failure ?- Patient has inherent risk of MRSA and Pseudomonas infection ?- Increase antibiotic coverage for MRSA and Pseudomonas with Vanco and cefepime ?- Continue azithromycin daily ?- Blood cultures x2 ordered, lactic acid for admission, procalcitonin a.m. labs  ordered ? ?Bilateral pleural effusion ?- Right greater than left ?- Ultrasound-guided thoracentesis, right-sided, with  labs ordered ?- Avoid aggressive fluid resuscitation ? ?Insulin dependent type 2 diabetes mellitus ([place]) ?- Insulin SSI with agents coverage ordered ?- Goal inpatient blood glucose levels 140-180 ? ?Procedures: None ? ?Consultations: Palliative ? ?The results of significant diagnostics from this hospitalization (including imaging, microbiology, ancillary and laboratory) are listed below for reference.  ? ?Significant Diagnostic Studies: ?CT CHEST WO CONTRAST ? ?Result Date: 09/14/2021 ?CLINICAL DATA:  Pleural effusion, hypoxia EXAM: CT CHEST WITHOUT CONTRAST TECHNIQUE: Multidetector CT imaging of the chest was performed following the standard protocol without IV contrast. RADIATION DOSE REDUCTION: This exam was performed according to the departmental dose-optimization program which includes automated exposure control, adjustment of the mA and/or kV according to patient size and/or use of iterative reconstruction technique. COMPARISON:  None FINDINGS: Cardiovascular: Cardiac size is mildly enlarged. Extensive multi-vessel coronary artery calcification. Hypoattenuation of the cardiac blood pool is in keeping with at least moderate anemia. Left subclavian dual lead pacemaker is in place with leads within the right atrium and right ventricle. No pericardial effusion. The central pulmonary arteries are of normal caliber. Moderate atherosclerotic calcification within the thoracic aorta. No aortic aneurysm. Mediastinum/Nodes: There is fluid in debris seen within the esophagus which may relate to gastroesophageal reflux, distal obstruction, or esophageal dysmotility. This is not well assessed on this examination. Visualized thyroid is unremarkable. No pathologic thoracic adenopathy, though imaging is slightly limited by noncontrast technique. Lungs/Pleura: Large right pleural effusion is present. Moderate left pleural effusion is present. Left pleural effusion demonstrates associated pleural thickening as well as  heterogeneously hyperdense intraluminal contents within its dependent portion possibly representing blood or proteinaceous material. Together, these findings suggest a complex effusion such as a malignant pleural effusion, hemothorax or empyema. There is marked left-sided volume loss with near complete collapse of the left lung. There is moderate right basilar compressive atelectasis. Superimposed multifocal pulmonary infiltrate is present scattered throughout the aerated lungs bilaterally which may be infectious or inflammatory in nature. There is extensive airway impaction involving the left lobar and segmental pulmonary bronchi. Upper Abdomen: Cholelithiasis noted. The right kidney is markedly atrophic. 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 Stable cortical cyst is seen within the interpolar region of the left kidney. No further follow-up is recommended for this lesion. No acute abnormality noted. Musculoskeletal: There is diffuse body wall wasting as well as subcutaneous edema within the subcutaneous fat of the body wall in keeping with anasarca, possibly related to hypoproteinemia. Chronic comminuted nonunited fracture of the right humeral head. No acute bone abnormality. IMPRESSION: Extensive fluid in debris within the esophagus which may relate to esophageal dysmotility, gastroesophageal reflux or distal obstruction. Extensive airway impaction within the left lung with marked left-sided volume loss. Multifocal pulmonary infiltrates, likely infectious or inflammatory. Given the above findings, this may relate to aspiration though multifocal infection could appear similarly. Associated bilateral pleural effusions, right greater than left. Left pleural effusion demonstrates complicated features, as described above. Differential considerations include empyema, hemothorax, or malignant effusion. Extensive coronary artery calcification.  Mild global  cardiomegaly. At least moderate anemia. Diffuse body wall wasting and associated diffuse body wall subcutaneous edema may relate to hypoproteinemia. Cholelithiasis Aortic Atherosclerosis (ICD10-I70.0). Electronically Signed

## 2021-09-10 NOTE — Assessment & Plan Note (Signed)
-   Possible elevated creatinine versus AKI versus progression of CKD 4 ?- BMP in the a.m. ?

## 2021-09-10 NOTE — Progress Notes (Signed)
Placed patient on HFNC per Dr Charna Archer. Patient tolerated interventions well. ?

## 2021-09-10 NOTE — Assessment & Plan Note (Addendum)
-   Patient is candidate for comfort measures ?- I have discussed comfort measures with daughter, she does not appear to be ready at this time ?

## 2021-09-10 NOTE — ED Notes (Signed)
Pt being placed on Bipap at this time by RT ?

## 2021-09-10 NOTE — Assessment & Plan Note (Signed)
-   Not compensated at this time ?- Strict I's and O's ?

## 2021-09-10 NOTE — Progress Notes (Signed)
Transitioned patient from high flow nasal cannula to bipap. Low volumes, low o2 sats, low blood pressure noted upon placement of therapy. RN at bedside for assessment. MD called to verify demise of patient.  ?

## 2021-09-10 NOTE — Progress Notes (Signed)
Pharmacy Antibiotic Note ? ?[name] is a 81 y.o. male admitted on 09/03/2021 with CAP.  Pharmacy has been consulted for Cefepime & Vancomycin dosing x 5 days. ? ?Plan: ?Cefepime 2 gm q24h per indication & renal fxn. ? ?Vancomycin 1 gm once ?Vancomycin 500 mg IV Q 48 hrs.  ?Goal AUC 400-550. ?Expected AUC: 496.5 ?SCr used: 2.84, TBW 45 kg << IBW 73.0 kg ? ?Height: 5\' 10"  (177.8 cm) ?Weight: 45 kg (99 lb 3.2 oz) ?IBW/kg (Calculated) : 73 ? ?Temp (24hrs), Avg:97.9 ?F (36.6 ?C), Min:97.9 ?F (36.6 ?C), Max:97.9 ?F (36.6 ?C) ? ?Recent Labs  ?Lab 08/21/2021 ?2002  ?WBC 8.2  ?CREATININE 2.84*  ?  ?Estimated Creatinine Clearance: 13.4 mL/min (A) (by C-G formula based on SCr of 2.84 mg/dL (H)).   ? ?No Known Allergies ? ?Antimicrobials this admission: ?4/22 Azithromycin >> x 5 day ?4/22 Cefepime >> x 5 days ?4/22 Vancomycin >> 5 days ? ?Microbiology results: ?4/22 BCx: Pending ?4/22 UCx: Pending  ? ?Thank you for allowing pharmacy to be a part of this patient?s care. ? ?Renda Rolls, PharmD, MBA ?09/02/2021 ?10:42 PM ? ? ?

## 2021-09-10 NOTE — Assessment & Plan Note (Signed)
-   Continue high flow nasal cannula to maintain SPO2 greater than 92% ?- Etiology is multifactorial including multifocal pneumonia and bilateral pleural effusion ?- Admit to stepdown, inpatient ?

## 2021-09-10 NOTE — Assessment & Plan Note (Signed)
-  Patient met severe sepsis with increased respiration rate, increased heart rate, source of infection is pneumonia, and respiratory failure ?- Patient has inherent risk of MRSA and Pseudomonas infection ?- Increase antibiotic coverage for MRSA and Pseudomonas with Vanco and cefepime ?- Continue azithromycin daily ?- Blood cultures x2 ordered, lactic acid for admission, procalcitonin a.m. labs ordered ?

## 2021-09-10 NOTE — ED Provider Notes (Signed)
? ?[facility] ?Provider Note ? ? ? Event Date/Time  ? First MD Initiated Contact with Patient 09/09/2021 1957   ?  (approximate) ? ? ?History  ? ?Chief Complaint ?Respiratory Distress (Patient presents with respiratory distress on CPAP by EMS; EMS was originally called for 3 episodes of vomiting but upon arrival, patient has a decreased level of consciousness and was 70% on RA with minimal breath sounds) ? ? ?HPI ? ?Kevin Gibson is a 81 y.o. male with past medical history of hypertension, hyperlipidemia, diabetes, CKD, and CHF who presents to the ED complaining of shortness of breath.  History is limited due to patient's disorientation and majority of history is obtained from EMS.  They state that they received a call from [facility], where patient resides, that he had vomited 3 times.  When EMS arrived, they found patient minimally responsive with O2 sats in the mid 70s.  He was placed on CPAP and brought to the ED for further evaluation, EMS reports that patient unable to answer any questions.  They were told by staff on the scene that patient has a DNR, but staff were unable to provide the DNR.  Patient is able to nod his head to respond to questions, nods "yes" that he is having trouble breathing, but nods "no" to any pain, including chest pain. ?  ? ? ?Physical Exam  ? ?Triage Vital Signs: ?ED Triage Vitals  ?Enc Vitals Group  ?   BP   ?   Pulse   ?   Resp   ?   Temp   ?   Temp src   ?   SpO2   ?   Weight   ?   Height   ?   Head Circumference   ?   Peak Flow   ?   Pain Score   ?   Pain Loc   ?   Pain Edu?   ?   Excl. in Max?   ? ? ?Most recent vital signs: ?Vitals:  ? 08/22/2021 2100 08/29/2021 2115  ?BP: (!) 181/106 (!) 184/171  ?Pulse: 80 83  ?Resp: (!) 22 (!) 23  ?Temp:    ?SpO2: 96% 97%  ? ? ?Constitutional: Somnolent but arousable to voice. ?Eyes: Conjunctivae are normal. ?Head: Atraumatic. ?Nose: No congestion/rhinnorhea. ?Mouth/Throat: Mucous membranes are moist.  ?Cardiovascular:  Normal rate, regular rhythm. Grossly normal heart sounds.  2+ radial pulses bilaterally. ?Respiratory: Tachypneic with increased respiratory effort.  No retractions.  Lungs with crackles bilaterally. ?Gastrointestinal: Soft and nontender. No distention. ?Musculoskeletal: No lower extremity tenderness nor edema.  ?Neurologic: No gross focal neurologic deficits are appreciated. ? ? ? ?ED Results / Procedures / Treatments  ? ?Labs ?(all labs ordered are listed, but only abnormal results are displayed) ?Labs Reviewed  ?CBC WITH DIFFERENTIAL/PLATELET - Abnormal; Notable for the following components:  ?    Result Value  ? RBC 2.89 (*)   ? Hemoglobin 8.6 (*)   ? HCT 28.6 (*)   ? RDW 15.9 (*)   ? Lymphs Abs 0.4 (*)   ? All other components within normal limits  ?COMPREHENSIVE METABOLIC PANEL - Abnormal; Notable for the following components:  ? Glucose, Bld 217 (*)   ? BUN 55 (*)   ? Creatinine, Ser 2.84 (*)   ? Calcium 8.7 (*)   ? Albumin 2.7 (*)   ? GFR, Estimated 22 (*)   ? All other components within normal limits  ?BRAIN NATRIURETIC PEPTIDE -  Abnormal; Notable for the following components:  ? B Natriuretic Peptide 1,974.6 (*)   ? All other components within normal limits  ?BLOOD GAS, VENOUS - Abnormal; Notable for the following components:  ? pO2, Ven 57 (*)   ? Bicarbonate 30.2 (*)   ? Acid-Base Excess 3.9 (*)   ? All other components within normal limits  ?TROPONIN I (HIGH SENSITIVITY) - Abnormal; Notable for the following components:  ? Troponin I (High Sensitivity) 35 (*)   ? All other components within normal limits  ?RESP PANEL BY RT-PCR (FLU A&B, COVID) ARPGX2  ?URINE CULTURE  ?PROCALCITONIN  ?MAGNESIUM  ?PROTIME-INR  ?CORTISOL-AM, BLOOD  ?PROCALCITONIN  ?URINALYSIS, COMPLETE (UACMP) WITH MICROSCOPIC  ?BASIC METABOLIC PANEL  ?CBC  ?TROPONIN I (HIGH SENSITIVITY)  ? ? ? ?EKG ? ?ED ECG REPORT ?Tempie Hoist, the attending physician, personally viewed and interpreted this ECG. ? ? Date: 09/13/2021 ? EKG Time:  19:54 ? Rate: 90 ? Rhythm: normal sinus rhythm ? Axis: Normal ? Intervals:left bundle branch block ? ST&T Change: None ? ?RADIOLOGY ?Chest x-ray reviewed by me with complete opacification of left lung, likely secondary to large effusion. ? ?PROCEDURES: ? ?Critical Care performed: Yes, see critical care procedure note(s) ? ?.Critical Care ?Performed by: Blake Divine, MD ?Authorized by: Blake Divine, MD  ? ?Critical care provider statement:  ?  Critical care time (minutes):  45 ?  Critical care time was exclusive of:  Separately billable procedures and treating other patients and teaching time ?  Critical care was necessary to treat or prevent imminent or life-threatening deterioration of the following conditions:  Respiratory failure ?  Critical care was time spent personally by me on the following activities:  Development of treatment plan with patient or surrogate, discussions with consultants, evaluation of patient's response to treatment, examination of patient, ordering and review of laboratory studies, ordering and review of radiographic studies, ordering and performing treatments and interventions, pulse oximetry, re-evaluation of patient's condition and review of old charts ?  I assumed direction of critical care for this patient from another provider in my specialty: no   ?  Care discussed with: admitting provider   ? ? ?MEDICATIONS ORDERED IN ED: ?Medications  ?azithromycin (ZITHROMAX) 500 mg in sodium chloride 0.9 % 250 mL IVPB (500 mg Intravenous New Bag/Given 09/07/2021 2215)  ?acetaminophen (TYLENOL) tablet 650 mg (has no administration in time range)  ?  Or  ?acetaminophen (TYLENOL) suppository 650 mg (has no administration in time range)  ?ondansetron (ZOFRAN) tablet 4 mg (has no administration in time range)  ?  Or  ?ondansetron (ZOFRAN) injection 4 mg (has no administration in time range)  ?heparin injection 5,000 Units (has no administration in time range)  ?vancomycin (VANCOCIN) IVPB 1000  mg/200 mL premix (has no administration in time range)  ?azithromycin (ZITHROMAX) 500 mg in sodium chloride 0.9 % 250 mL IVPB (has no administration in time range)  ?polyethylene glycol (MIRALAX / GLYCOLAX) packet 17 g (has no administration in time range)  ?ceFEPIme (MAXIPIME) 2 g in sodium chloride 0.9 % 100 mL IVPB (2 g Intravenous New Bag/Given 09/09/2021 2136)  ?lactated ringers bolus 500 mL (0 mLs Intravenous Stopped 09/15/2021 2215)  ? ? ? ?IMPRESSION / MDM / ASSESSMENT AND PLAN / ED COURSE  ?I reviewed the triage vital signs and the nursing notes. ?             ?               ? ?  82 y.o. male with past medical history of hypertension, hyperlipidemia, diabetes, CHF, and CKD who presents to the ED after reportedly vomiting 3 times at his nursing facility and then being found with decreased responsiveness and difficulty breathing. ? ?Differential diagnosis includes, but is not limited to, aspiration, pneumonia, ACS, PE, CHF, COPD. ? ?Patient arrives ill-appearing and in moderate respiratory distress with increased respiratory effort, maintaining O2 sats at 92% on CPAP with EMS.  Patient's primary issue appears to be oxygenation rather than ventilation at this time, and given that he has already vomited multiple times, we will place him on high flow nasal cannula rather than BiPAP.  EKG shows no evidence of arrhythmia or ischemia, low suspicion for ACS or PE, aspiration seems likely cause of his acute hypoxic respiratory failure.  Chest x-ray is pending, will also screen labs including troponin, BNP, CMP, procalcitonin. ? ?Chest x-ray reveals near complete opacification of left lung, concerning for large effusion versus extensive infection.  This was further assessed with CT scan and patient noted to have food and other material in the esophagus along with airway impaction in the left lung with multifocal infiltrates concerning for aspiration.  Patient given cefepime and azithromycin for infection, did require  increase in FiO2 on high flow nasal cannula along with settings at 60 L/min.  Labs with AKI on top of chronic kidney disease, new acute electrolyte abnormality noted, magnesium within normal limits.  BNP elevated bu

## 2021-09-10 NOTE — ED Notes (Addendum)
Nursing staff brought to bedside due to pt low BP. This nurse and MD unable to feel radial and carotid artery. No breath sounds heard and no chest rise and fall was found.. DO Cox brought to bedside. Time of death Aug 19, 2335 ?

## 2021-09-10 NOTE — Hospital Course (Signed)
Mr. 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 ? ?Initial vitals in the emergency department showed temperature of 97.9, respiration rate of 26, heart rate of 90, blood pressure 121/96, SPO2 of 88% on CPAP which improved to 96% on high flow nasal cannula. ?Serum sodium was 141, potassium 4.9, chloride 105, bicarb 25, BUN 55, serum creatinine of 2.84, nonfasting blood glucose 217, GFR of 22.  WBC 8.2, hemoglobin 8.6, platelets of 268. ? ?BNP was elevated 1974.6.  High sensitive troponin was 35.  Procalcitonin was 0.43. ? ?VBG was 7.3 8/51/57 ? ?ED treatment: Patient improved on CPAP, and oxygen supplementation was changed to high flow nasal cannula with good tolerance.  Azithromycin 500 mg IV one-time dose, ceftriaxone 2 g IV, LR 500 mL bolus one-time dose. ?

## 2021-09-14 DIAGNOSIS — N179 Acute kidney failure, unspecified: Secondary | ICD-10-CM | POA: Diagnosis present

## 2021-09-19 NOTE — Progress Notes (Signed)
?   Oct 04, 2021 0005  ?Clinical Encounter Type  ?Visited With Family  ?Visit Type Death  ?Referral From Nurse  ?Spiritual Encounters  ?Spiritual Needs Grief support;Emotional  ? ?Chaplain responded to page to comfort family after the death of loved one.  ?

## 2021-09-19 NOTE — ED Notes (Signed)
[facility] contacted, per [employer], coordinator, pt will be a hold for eye and tissue donation candidate, reference number [id number] ?

## 2021-09-19 DEATH — deceased

## 2021-09-22 DIAGNOSIS — J9601 Acute respiratory failure with hypoxia: Secondary | ICD-10-CM | POA: Diagnosis present

## 2021-10-20 NOTE — Assessment & Plan Note (Signed)
-   Continue high flow nasal cannula to maintain SPO2 greater than 92% ?- Etiology is multifactorial including multifocal pneumonia and bilateral pleural effusion ?- Admit to stepdown, inpatient ?

## 2024-01-09 IMAGING — DX DG CHEST 1V PORT
1 series · 1 of 1 positions shown · non-contrast
Comparison: 11/04/2018

CLINICAL DATA: Shortness of breath

EXAM:
PORTABLE CHEST 1 VIEW

[chest ap]
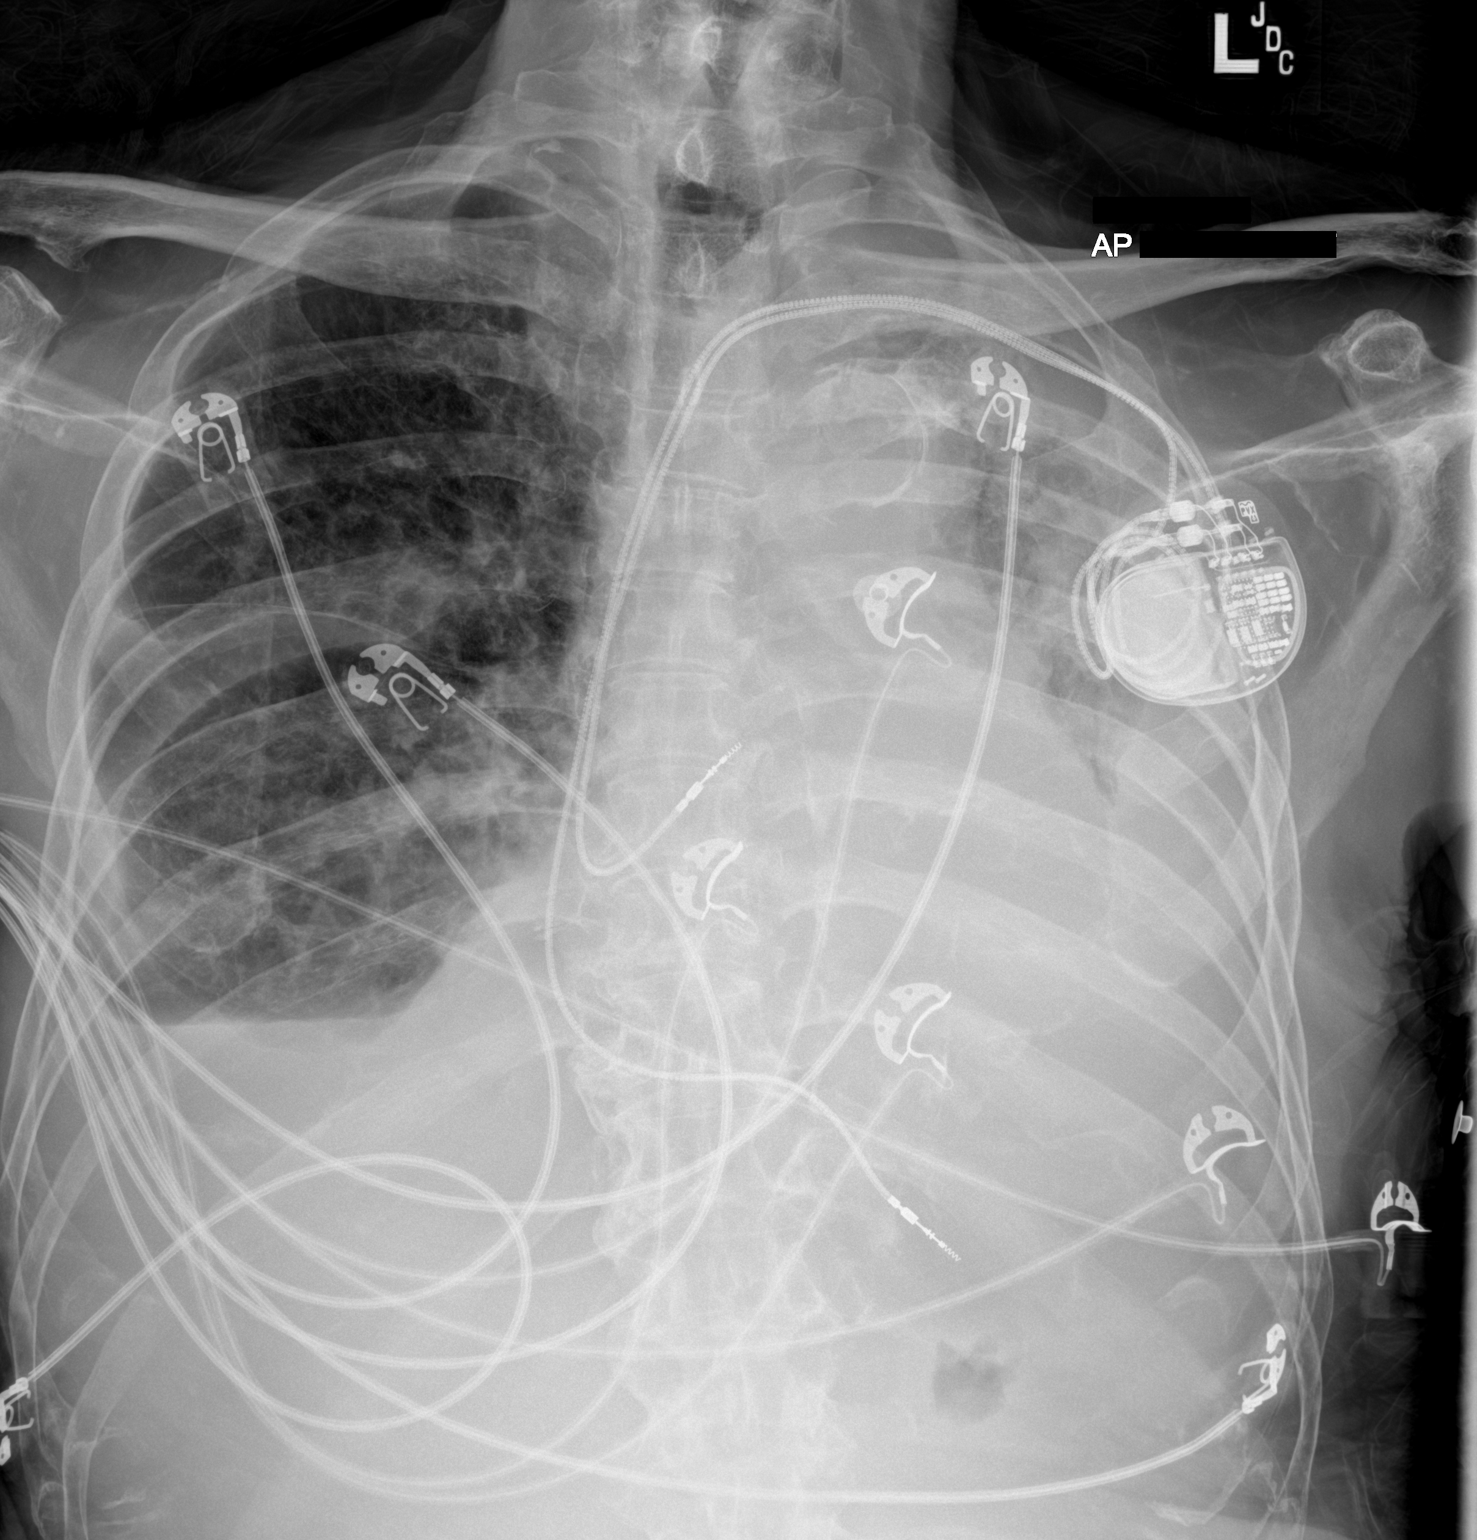

[1 of 1 positions shown; findings below may reference images not displayed]

FINDINGS: Left-sided pacing device as before. Moderate to large left-sided
pleural effusion with probable loculation. Moderate right-sided
pleural effusion. Airspace disease at the bases and diffusely in the
left thorax. Obscured cardiomediastinal silhouette with aortic
atherosclerosis.
IMPRESSION: 1. Moderate to large left-sided effusion with probable loculation,
shift of mediastinal contents to the left consistent with volume
loss and potential centrally obstructing process. Consider
correlation with CT.
2. Moderate right-sided pleural effusion. Dense airspace disease at
both bases which may be due to atelectasis or pneumonia
